# Patient Record
Sex: Female | Born: 1945 | Hispanic: No | Smoking: Never smoker
Health system: Southern US, Community
[De-identification: ages and names within clinical notes are randomized; demographics above are authoritative.]

## PROBLEM LIST (undated history)

## (undated) DIAGNOSIS — Z780 Asymptomatic menopausal state: Secondary | ICD-10-CM

## (undated) DIAGNOSIS — I10 Essential (primary) hypertension: Secondary | ICD-10-CM

## (undated) DIAGNOSIS — K5790 Diverticulosis of intestine, part unspecified, without perforation or abscess without bleeding: Secondary | ICD-10-CM

## (undated) DIAGNOSIS — R0602 Shortness of breath: Secondary | ICD-10-CM

## (undated) DIAGNOSIS — E119 Type 2 diabetes mellitus without complications: Secondary | ICD-10-CM

## (undated) DIAGNOSIS — B379 Candidiasis, unspecified: Secondary | ICD-10-CM

## (undated) DIAGNOSIS — R7303 Prediabetes: Secondary | ICD-10-CM

## (undated) DIAGNOSIS — K759 Inflammatory liver disease, unspecified: Secondary | ICD-10-CM

## (undated) DIAGNOSIS — E669 Obesity, unspecified: Secondary | ICD-10-CM

## (undated) DIAGNOSIS — M1990 Unspecified osteoarthritis, unspecified site: Secondary | ICD-10-CM

## (undated) DIAGNOSIS — Z8619 Personal history of other infectious and parasitic diseases: Secondary | ICD-10-CM

## (undated) HISTORY — DX: Asymptomatic menopausal state: Z78.0

## (undated) HISTORY — DX: Essential (primary) hypertension: I10

## (undated) HISTORY — PX: REPLACEMENT TOTAL KNEE BILATERAL: SUR1225

## (undated) HISTORY — PX: JOINT REPLACEMENT: SHX530

## (undated) HISTORY — DX: Unspecified osteoarthritis, unspecified site: M19.90

## (undated) HISTORY — DX: Prediabetes: R73.03

## (undated) HISTORY — DX: Obesity, unspecified: E66.9

## (undated) HISTORY — PX: ABDOMINAL HYSTERECTOMY: SHX81

## (undated) HISTORY — DX: Diverticulosis of intestine, part unspecified, without perforation or abscess without bleeding: K57.90

## (undated) HISTORY — DX: Candidiasis, unspecified: B37.9

---

## 2003-02-21 ENCOUNTER — Ambulatory Visit (HOSPITAL_COMMUNITY): Admission: RE | Admit: 2003-02-21 | Discharge: 2003-02-21 | Payer: Self-pay | Admitting: Family Medicine

## 2003-05-07 ENCOUNTER — Ambulatory Visit (HOSPITAL_BASED_OUTPATIENT_CLINIC_OR_DEPARTMENT_OTHER): Admission: RE | Admit: 2003-05-07 | Discharge: 2003-05-07 | Payer: Self-pay | Admitting: Obstetrics and Gynecology

## 2003-05-07 ENCOUNTER — Ambulatory Visit (HOSPITAL_COMMUNITY): Admission: RE | Admit: 2003-05-07 | Discharge: 2003-05-07 | Payer: Self-pay | Admitting: Obstetrics and Gynecology

## 2003-05-07 ENCOUNTER — Encounter (INDEPENDENT_AMBULATORY_CARE_PROVIDER_SITE_OTHER): Payer: Self-pay | Admitting: Specialist

## 2003-07-09 ENCOUNTER — Encounter (INDEPENDENT_AMBULATORY_CARE_PROVIDER_SITE_OTHER): Payer: Self-pay | Admitting: Specialist

## 2003-07-09 ENCOUNTER — Encounter (INDEPENDENT_AMBULATORY_CARE_PROVIDER_SITE_OTHER): Payer: Self-pay | Admitting: *Deleted

## 2003-07-09 ENCOUNTER — Observation Stay (HOSPITAL_COMMUNITY): Admission: RE | Admit: 2003-07-09 | Discharge: 2003-07-10 | Payer: Self-pay | Admitting: Obstetrics and Gynecology

## 2004-02-24 ENCOUNTER — Ambulatory Visit (HOSPITAL_COMMUNITY): Admission: RE | Admit: 2004-02-24 | Discharge: 2004-02-24 | Payer: Self-pay | Admitting: Family Medicine

## 2005-02-24 ENCOUNTER — Ambulatory Visit (HOSPITAL_COMMUNITY): Admission: RE | Admit: 2005-02-24 | Discharge: 2005-02-24 | Payer: Self-pay | Admitting: Family Medicine

## 2005-12-31 ENCOUNTER — Inpatient Hospital Stay (HOSPITAL_COMMUNITY): Admission: RE | Admit: 2005-12-31 | Discharge: 2006-01-04 | Payer: Self-pay | Admitting: Orthopedic Surgery

## 2006-02-25 ENCOUNTER — Ambulatory Visit (HOSPITAL_COMMUNITY): Admission: RE | Admit: 2006-02-25 | Discharge: 2006-02-25 | Payer: Self-pay | Admitting: Family Medicine

## 2007-02-28 ENCOUNTER — Ambulatory Visit (HOSPITAL_COMMUNITY): Admission: RE | Admit: 2007-02-28 | Discharge: 2007-02-28 | Payer: Self-pay | Admitting: Family Medicine

## 2008-01-19 ENCOUNTER — Inpatient Hospital Stay (HOSPITAL_COMMUNITY): Admission: RE | Admit: 2008-01-19 | Discharge: 2008-01-23 | Payer: Self-pay | Admitting: Orthopedic Surgery

## 2008-06-03 ENCOUNTER — Emergency Department (HOSPITAL_COMMUNITY): Admission: EM | Admit: 2008-06-03 | Discharge: 2008-06-03 | Payer: Self-pay | Admitting: Emergency Medicine

## 2008-06-12 ENCOUNTER — Encounter: Admission: RE | Admit: 2008-06-12 | Discharge: 2008-06-12 | Payer: Self-pay | Admitting: Orthopedic Surgery

## 2008-07-10 ENCOUNTER — Ambulatory Visit (HOSPITAL_COMMUNITY): Admission: RE | Admit: 2008-07-10 | Discharge: 2008-07-10 | Payer: Self-pay | Admitting: Family Medicine

## 2009-04-12 DIAGNOSIS — Z8619 Personal history of other infectious and parasitic diseases: Secondary | ICD-10-CM

## 2009-04-12 HISTORY — DX: Personal history of other infectious and parasitic diseases: Z86.19

## 2009-07-18 ENCOUNTER — Ambulatory Visit (HOSPITAL_COMMUNITY): Admission: RE | Admit: 2009-07-18 | Discharge: 2009-07-18 | Payer: Self-pay | Admitting: Family Medicine

## 2010-05-06 ENCOUNTER — Other Ambulatory Visit (HOSPITAL_COMMUNITY): Payer: Self-pay | Admitting: Family Medicine

## 2010-05-06 DIAGNOSIS — Z0000 Encounter for general adult medical examination without abnormal findings: Secondary | ICD-10-CM

## 2010-05-06 DIAGNOSIS — Z1231 Encounter for screening mammogram for malignant neoplasm of breast: Secondary | ICD-10-CM

## 2010-07-24 ENCOUNTER — Ambulatory Visit (HOSPITAL_COMMUNITY)
Admission: RE | Admit: 2010-07-24 | Discharge: 2010-07-24 | Disposition: A | Discharge status: Home / Self Care | Payer: Medicare Other | Source: Ambulatory Visit | Attending: Family Medicine | Admitting: Family Medicine

## 2010-07-24 DIAGNOSIS — Z1231 Encounter for screening mammogram for malignant neoplasm of breast: Secondary | ICD-10-CM

## 2010-08-25 NOTE — Discharge Summary (Signed)
[name], [name]               ACCOUNT NO.:  [id number]   MEDICAL RECORD NO.:  [id number]          PATIENT TYPE:  INP   LOCATION:  1601                         FACILITY:  [facility]   PHYSICIAN:  Almedia Balls. Ranell Patrick, M.D. DATE OF BIRTH:  Feb 03, 1946   DATE OF ADMISSION:  01/19/2008  DATE OF DISCHARGE:  01/23/2008                               DISCHARGE SUMMARY   ADMISSION DIAGNOSIS:  Right knee end-stage osteoarthritis.   DISCHARGE DIAGNOSES:  Right knee end-stage osteoarthritis, status post  total knee arthroplasty   BRIEF HISTORY:  The patient is a 65 year old female with worsening right  knee pain secondary to end-stage osteoarthritis.  The patient elected to  have a total knee arthroplasty.   PROCEDURE:  The patient had a right total knee arthroplasty using DePuy  rotating platform prosthesis by Dr. Malon Holland on January 19, 2008.   ASSISTANT:  Standley Dakins PA-C.   General anesthesia was used.  Minimal blood loss and no complications.   HOSPITAL COURSE:  The patient was admitted for the above-stated  procedure on January 19, 2008, which she tolerated well and after  adequate time in the post anesthesia care unit, she was transferred up  to to 6 [place].  On postop day #1, she was complaining about moderate pain  to the right knee and not functioning with physical therapy quite well.  On postop day #1, labs were within acceptable limits.  Postop days 2-3,  she was able to work with physical therapy much better.  Her incision  was healing well, no signs of drainage.  The incision dressing was  changed daily.  Neurovascularly, she was intact.  Postop day #4, she was  doing much better.  She was ready to be discharged home with the aid of  a home health aide.  No other complications.   DISCHARGE/PLAN:  The patient will be discharged with home health  physical therapy, nursing home health aide on January 23, 2008.  Her  condition is stable.  Her diet is low-sodium.   DRUG  ALLERGIES:  NONE.   DISCHARGE MEDICATIONS:  1. Hydrochlorothiazide 12.5 mg p.o. daily.  2. Lisinopril 20 mg p.o. daily.  3. Multivitamin daily.  4. Verapamil 240 mg p.o. daily.  5. Coumadin per pharmacy protocol.  6. Percocet 5/325 one-to-two tablets q.4-6 h., p.r.n. pain.  7. Robaxin 500 mg p.o. q.6 h.   FOLLOW UP:  The patient will follow up with Dr. Malon Holland in 2  weeks.      Thomas B. Durwin Nora, P.A.      Almedia Balls. Ranell Patrick, M.D.  Electronically Signed    TBD/MEDQ  D:  01/23/2008  T:  01/23/2008  Job:  [id number]

## 2010-08-25 NOTE — Op Note (Signed)
[name], [name]               ACCOUNT NO.:  [id number]   MEDICAL RECORD NO.:  [id number]          PATIENT TYPE:  INP   LOCATION:  1601                         FACILITY:  [facility]   PHYSICIAN:  Almedia Balls. Ranell Patrick, M.D. DATE OF BIRTH:  04/26/1945   DATE OF PROCEDURE:  01/19/2008  DATE OF DISCHARGE:                               OPERATIVE REPORT   PREOPERATIVE DIAGNOSIS:  Right knee end-stage osteoarthritis.   POSTOPERATIVE DIAGNOSIS:  Right knee end-stage osteoarthritis.   PROCEDURE PERFORMED:  Right knee total knee replacement using DePuy  segmental rotating platform prosthesis.   ATTENDING SURGEON:  Beverely Low, M.D.   ASSISTANT:  Konrad Felix [place], [place].   ANESTHESIA:  General anesthesia used plus femoral block.   ESTIMATED BLOOD LOSS:  Minimal.   TOURNIQUET TIME:  One hour and 30 minutes at 350 mmHg.   FLUID REPLACED:  18 mL crystalloid.   URINE OUTPUT:  500 mL.   INSTRUMENT COUNT:  Correct.   COMPLICATIONS:  None.   ANTIBIOTICS:  Perioperative antibiotics were given.   INDICATIONS:  The patient is a 65 year old female who presents with a  history of worsening right knee pain and function secondary to end-stage  arthritis in the right knee.  The patient this had prior left total knee  replacement and has done well with that.  She presents now for a right  total knee replacement.  Risks and benefits of surgery were discussed.  The patient elected to proceed with surgery.  Informed consent was  obtained.   DESCRIPTION OF PROCEDURE:  After an adequate level of anesthesia was  achieved, the patient was positioned supine on the table.  A nonsterile  tourniquet was placed on the right proximal thigh.  The right leg was  sterilely prepped and draped in the usual manner.  After exsanguination  of limb using the Esmarch bandage, we elevated the tourniquet to 350  mmHg. A longitudinal midline incision was created with the knee in  flexion.  A medial parapatellar arthrotomy  was created with the patella  inverted and the lateral patellofemoral ligaments divided.  The distal  femur was entered using a step-cut drill, intramedullary guide inserted.  The distal femur was resected 10 mm, set on 5 degrees right.  We then  sized the femur to 2.5 and performed anterior-posterior chamfer cuts  using more than one block.  Attention was directed towards appropriate  rotation of the implant with epicondylar axis reference.  Following  this, we removed ACL, PCL meniscal tissue and subluxed the tibia  anteriorly.  We then cut the tibia 90 degrees perpendicular to the long  axis of the tibia with minimal posterior slope.  Once this was done, we  checked our flexion/extension gaps and both were symmetric at 10 mm.  At  this point, we went ahead and completed our preparation of the tibia  with the modular keel punch and drill.  We then went ahead and sized the  tibia to a size 2.5 and prepared the tibia appropriately.  We then used  our box cut guide to remove excess bone from the femur  enabling [place] to  place the 2.5 right femur posterior cruciate substituting prosthesis.  We then trialed with a 12.5 insert and we were happy with soft tissue  balancing in full extension.  We then resurfaced the patella with  freehand technique going from 24 mm down to 16 mm to allow for 8 mm of  the polyethylene patellar button which was size 32.  We trialed the knee  through full range of motion and we were happy with the patellar  tracking with no hand technique.  We then removed the trial components,  we removed excess posterior bone off the femur using a lamina spreader  for visualization.  We inspected the knee for any other bony debris.  We  then plugged into the femur with available bone and then cemented the  components into place using DePuy high-viscosity cement. Once the cement  was allowed to harden, we trialed with a 12.5 and then a 15; we were  happy with the 15.  We were able to  get full extension and had good  balance in both extension and flexion.  We then went ahead and  thoroughly irrigated the knee.  We placed the real 15 insert in the knee  and then removed any excess cement using a quarter-inch curved  osteotome.  Next, we went ahead and closed the knee with the knee in  flexion using #1 Vicryl suture interrupted figure-of-eight followed by 0  and 2-0 Vicryl layered subcutaneous closure and staples for skin.  A  sterile dressing was applied followed by a knee immobilizer.  The  patient was taken to the recovery room.      Almedia Balls. Ranell Patrick, M.D.  Electronically Signed     SRN/MEDQ  D:  01/19/2008  T:  01/19/2008  Job:  [id number]

## 2010-08-25 NOTE — H&P (Signed)
NAMESHELANDA, [name]               ACCOUNT NO.:  [id number]   MEDICAL RECORD NO.:  [id number]          PATIENT TYPE:  INP   LOCATION:  NA                           FACILITY[facility]   PHYSICIAN:  Almedia Balls. Ranell Patrick, M.D. DATE OF BIRTH:  May 31, 1945   DATE OF ADMISSION:  DATE OF DISCHARGE:                              HISTORY & PHYSICAL   CHIEF COMPLAINT:  Right knee pain.   HISTORY OF PRESENT ILLNESS:  The patient is a 65 year old female with  worsening right knee pain secondary to severe osteoarthritis.  The  patient has had a previous left total knee arthroplasty and is  requesting arthroplasty on the right knee.  The patient's symptoms have  been refractory to conservative treatment.   PAST MEDICAL HISTORY:  Hypertension and obesity.   FAMILY MEDICAL HISTORY:  Coronary artery disease, stroke, and  hypertension.   SOCIAL HISTORY:  Patient of Dr. Carolin Coy, does not smoke but  occasional alcohol use.   DRUG ALLERGIES:  None.   CURRENT MEDICATIONS:  1. Verapamil 240 mg p.o. daily.  2. Quinaretic 25 mg p.o. daily.   REVIEW OF SYSTEMS:  Pain with ambulation.   PHYSICAL EXAMINATION:  VITAL SIGNS:  Pulse 80, respirations 16, blood  pressure 130/82.  GENERAL:  The patient is a healthy-appearing 65 year old female in no  acute distress.  Pleasant mood and affect, alert and oriented x3.  HEENT AND NECK:  Exam shows full range of motion without any tenderness.  CHEST:  Active breath sounds bilaterally with no wheezes, rhonchi or  rales.  HEART:  Shows regular rate and rhythm.  No murmur.  ABDOMEN:  Nontender, nondistended with active bowel sounds.  EXTREMITIES:  Show moderate tenderness with crepitus at the right knee  with range of motion, especially in medial joint line.  She has never no  rashes.  She has some minimal pedal edema distally, but she does have a  normal heel-toe gait.   X-rays show end-stage osteoarthritis to the right knee.   IMPRESSION:  1. Right knee  end-stage osteoarthritis.   PLAN OF ACTION:  Right total knee arthroplasty by [name].      Thomas B. Durwin Nora, P.A.      Almedia Balls. Ranell Patrick, M.D.  Electronically Signed    TBD/MEDQ  D:  01/10/2008  T:  01/10/2008  Job:  [id number]

## 2010-08-28 NOTE — Op Note (Signed)
[name], [name]                           ACCOUNT NO.:  [id number]   MEDICAL RECORD NO.:  [id number]                   PATIENT TYPE:  OBV   LOCATION:  9316                                 FACILITY:  [facility]   PHYSICIAN:  Laqueta Linden, M.D.                 DATE OF BIRTH:  Apr 07, 1946   DATE OF PROCEDURE:  07/09/2003  DATE OF DISCHARGE:                                 OPERATIVE REPORT   PREOPERATIVE DIAGNOSIS:  Complex endometrial hyperplasia with focal atypia.   POSTOPERATIVE DIAGNOSIS:  Complex endometrial hyperplasia with focal atypia.   PROCEDURES:  Vaginal hysterectomy, cytologic washings.   SURGEON:  Laqueta Linden, M.D.   ASSISTANT:  Andres Ege, M.D.   ANESTHESIA:  General LMA switched to endotracheal tube.   ESTIMATED BLOOD LOSS:  200 mL.   URINE OUTPUT:  45 mL in-and-out catheterization at the conclusion of the  procedure.   FLUIDS REPLACED:  1300 mL crystalloid.   COUNTS:  Correct x2.   COMPLICATIONS:  None.   INDICATIONS:  [name] is a 65 year old gravida 5, para 5, widowed  black female from [place] who presents with a five-year history of  postmenopausal bleeding.  She apparently underwent a D&C in [place] in 1999  which showed hyperplasia, for which she was treated with high-dose birth  control pills for a short period of time, which she discontinued and had no  further follow-up or treatment until she presented upon referral from  Rema Fendt, F.N.P.  She underwent office biopsy followed by  hysteroscopic evaluation with curettage as an outpatient, revealing complex  endometrial hyperplasia with focal atypia bordering on well-differentiated  adenocarcinoma.  Due to her morbid obesity and huge panniculus and the  substantial risk of an abdominal procedure, it was elected to perform a  vaginal hysterectomy with removal of tubes and ovaries if technically  possible.  This was discussed with both John T. Kyla Balzarine, M.D., and De Blanch,  M.D., of the [facility] Department of GYN Oncology, who concurred  with this plan.  If the patient has a significant degree of carcinoma  identified in final pathology, then additional staging might be warranted.  The patient has seen the informed consent film and has voiced her  understanding and acceptance of all risks, benefits, alternatives, and  complications, including but not limited to anesthesia risk, infection,  bleeding possibly requiring transfusion, injury to bowel, bladder, ureters,  vessels, or nerves, possibility of fistula formation, postoperative  complications including DVT, PE, pneumonia, and even death, as well as the  potential need for laparoscopy or laparotomy to perform additional staging  and/or remove ovaries if this becomes necessary.  She has seen the informed  consent film and voiced her understanding and acceptance of all risks,  understands she is at an increased risk due to her size, and understands and  accepts the potential limitations of a vaginal approach with  the hopes that  it will be the only surgery necessary, and she agrees to proceed.  She has  received 1 g of IV Ancef antibiotic prophylaxis preoperatively.  Of note,  her surgery was scheduled for prior to today but was postponed due to an  episode of bronchitis, which was treated with antibiotics and has  subsequently cleared and she has been re-cleared from the anesthesia  standpoint.   PROCEDURE:  The patient was taken to the operating room and after proper  identification and consents were ascertained, she was placed on the  operating table in supine position.  After the induction of general LMA, she  was placed in the [place] stirrups and the perineum and vagina were prepped  and draped in a routine sterile fashion.  The bladder was emptied with a red  rubber catheter.  A weighted speculum was placed on the posterior vagina.  Due to the patient's obesity and some posterior relaxation, it was  difficult  to put the speculum in place.  The cervix was noted to descend to the spines  with traction.  Th portio was injected with a 1:100,000 solution of  epinephrine.  The cervix was then circumscribed with a scalpel and the  vaginal mucosa advanced off of the cervix using a finger and a Ray-Tec  sponge.  The posterior vaginal mucosa was then tented down and the cul-de-  sac entered sharply without difficulty.  Normal saline 50 mL was then  instilled and aspirated and sent in heparinized solution for cytologic  washings as per instructions from Dr. Kyla Balzarine.  The uterosacral ligaments were  clamped, cut, and suture ligated with 0 Vicryl suture.  Cardinal ligaments  were similarly clamped, cut, and suture ligated.  The anterior peritoneal  reflection was then identified and entered sharply without obvious injury or  entry into the bladder.  The retractor was placed anteriorly to retract the  bladder out of the operative field.  The uterine vessels were clamped  bilaterally, incorporating both anterior and posterior peritoneum, with  pedicles cut and suture ligated.  The uterine fundus was then flipped  posteriorly and the proximal adnexal pedicles were clamped, incorporating  both utero-ovarian ligament, proximal tube, and round ligament, with  excision of specimen, which was sent for final sectioning to pathology.  Both ovaries were tiny and streak-like in appearance and were out of reach  with no descent into the pelvis, and it was felt that an attempt to remove  these vaginally would radically increase the patient's need for a  laparotomy, and as per previous discussions it was elected not to proceed  with oophorectomy for this reason.  The adnexal pedicles were doubly ligated  with a free tie and then a stitch of 0 Vicryl.  Hemostasis was excellent.  There was some bleeding from the posterior vaginal cuff.  The posterior peritoneum was reefed to the posterior cuff using a running locked  stitch of  0 Vicryl with marked improvement in hemostasis.  The adnexal pedicles were  hemostatic.  The pedicles were cut and released into the peritoneal cavity.  The vaginal cuff was then closed from side to side using interrupted figure-  of-eight sutures of 0 Vicryl.  Hemostasis was noted to be excellent.  The  bladder was in-and-out catheterized at the conclusion of the procedure for  45 mL of clear urine that had accumulated during the less than one-hour  procedure.  In the course of the procedure an endotracheal tube was placed  for optimal ventilation.  The patient was successfully extubated and stable  on transfer to the recovery room.  She received Toradol 30 mg IV and 30 mg  IM prior to the conclusion of the procedure.  She will be admitted for  overnight observation with aggressive early ambulation as well as PAS  stockings while in bed to decrease her risk of DVT formation.  Final  pathology will be reviewed with subsequent management plan pending those  results.   COMPLICATIONS:  None.   ESTIMATED BLOOD LOSS:  200 mL.   URINE OUTPUT:  45 mL.   FLUIDS REPLACED:  1300 mL crystalloid.   COUNTS:  Correct x2.                                               Laqueta Linden, M.D.    LKS/MEDQ  D:  07/09/2003  T:  07/09/2003  Job:  [id number]   cc:   Marisue Humble F.N.P. [facility]

## 2010-08-28 NOTE — Op Note (Signed)
NAMESRINIKA, [name]                           ACCOUNT NO.:  [id number]   MEDICAL RECORD NO.:  [id number]                   PATIENT TYPE:  AMB   LOCATION:  NESC                                 FACILITY:  [facility]   PHYSICIAN:  Laqueta Linden, M.D.                 DATE OF BIRTH:  09-05-1945   DATE OF PROCEDURE:  05/07/2003  DATE OF DISCHARGE:                                 OPERATIVE REPORT   PREOPERATIVE DIAGNOSES:  Postmenopausal abnormal bleeding, complex  endometrial hyperplasia, rule out adenocarcinoma.   POSTOPERATIVE DIAGNOSES:  Postmenopausal abnormal bleeding, complex  endometrial hyperplasia, rule out adenocarcinoma.   PROCEDURE:  Diagnostic hysteroscopy with dilatation and curettage.   SURGEON:  Laqueta Linden, M.D.   ANESTHESIA:  General LMA.   NET SORBITOL INTAKE:  Negligible.   ESTIMATED BLOOD LOSS:  Less than 20 mL.   COMPLICATIONS:  None.   SPECIMENS:  To pathology.   INDICATIONS:  [name] is a 65 year old, gravida 5, para 5, menopausal  female with a 5 year history of intermittent postmenopausal bleeding.  She  apparently underwent a D&C in [place], revealing hyperplasia.  She was  treated with high-dose birth control pills with an increase in her bleeding,  so she quit the pills and has had no follow-up since.  She continues to have  intermittent bleeding since.  She underwent in-office endometrial sampling  which revealed sampling complex endometrial hyperplasia with a rare focus of  atypia.  She is now to undergo a diagnostic hysteroscopy with D&C to rule  out significant atypia or adenocarcinoma which would necessitate surgical  management.  She has seen the informed consent film, voiced her  understanding and acceptance of all the risks, benefits, alternatives, and  limitations to the procedure and agrees to proceed.   DESCRIPTION OF PROCEDURE:  The patient was taken to the operating room and  after proper identification and consents were  ascertained, she was placed on  the operating room table in supine position.  After the induction of general  LMA, she was placed in the [place] stirrups, and the perineum and vagina were  prepped and draped in a routine sterile fashion.  The bladder was emptied  with in-and-out catheterization.  Bimanual examination confirmed an anterior  pear-size, mobile uterus.  A weighted speculum was placed in the vagina and  a Deaver placed anteriorly, as there was so much perineal relaxation that  she could not maintain a speculum.  She had a significant amount of  cervicouterine descent with the cervix distending to within 2 cm of the  introitus.  The internal os was patent to a #17 dilator and sounded to 9-10  cm.  The internal os was dilated up to a #21 Pratt dilator, and the  hysteroscope with continuous sorbitol infusion was then inserted.  The  endocervical canal was free of lesions.  The endometrial cavity was  visualized  and appeared sort of diffusely thickened with some calcified-  appearing areas on the posterior fundus but no focal lesions or polyps  identified.  The scope was then withdrawn.  Sharp curettage productive of a  large amount of tissue was then performed.  It was felt that all surfaces of  the endometrial cavity had been aggressively sampled.  All instruments were  removed.  There was no active bleeding from the cervix.  The tenaculum site  was hemostatic.  The patient was stable on transfer to the PACU.  She will  be observed and discharged per anesthesia protocol.  She is to follow up in  the office in 2-3 weeks' time.  She was given routine verbal and written  discharge instructions and told to take Advair, Aleve, or Tylenol as needed  for cramping.  She will be contacted regarding the pathology and further  plans.                                               Laqueta Linden, M.D.    LKS/MEDQ  D:  05/07/2003  T:  05/07/2003  Job:  [id number]   cc:   Rema Fendt, FNP   [facility].

## 2010-08-28 NOTE — H&P (Signed)
NAMENUBIA, [name]               ACCOUNT NO.:  [id number]   MEDICAL RECORD NO.:  [id number]          PATIENT TYPE:  INP   LOCATION:  NA                           FACILITY:  [facility]   PHYSICIAN:  Almedia Balls. Ranell Patrick, M.D. DATE OF BIRTH:  [birth year]   DATE OF ADMISSION:  DATE OF DISCHARGE:                                HISTORY & PHYSICAL   [name] is a 65 year old female who comes in complaining about left  knee pain.   HISTORY OF PRESENT ILLNESS:  The patient has been having some increasing  left knee pain over the last several years.  She has been having an antalgic  gait and worsening pain that has been refractory to conservative methods.  The patient has elected to have a left total knee arthroplasty by [name].   PAST MEDICAL HISTORY:  Significant for:  1. Hypertension.  2. Diverticulitis.  3. Osteoarthritis.   DRUG ALLERGIES:  None.   CURRENT MEDICATIONS:  1. Verapamil 240 mg p.o. daily.  2. Quinaretic 20/25 one tablet daily.  3. Lamisil 250 mg one tablet p.o. daily.  4. Multivitamin.  5. Aspirin 81 mg p.o. daily.   FAMILY MEDICAL HISTORY:  Significant for stroke and osteoarthritis.   SOCIAL HISTORY:  The patient is a widow, she is retired.  Does not smoke or  use alcohol.  Primary care physician is Dr. Gerri Spore.   PHYSICAL EXAMINATION:  VITAL SIGNS:  Pulse 80, respirations 18, blood  pressure 142/88.  GENERAL:  Healthy-appearing 65 year old female in no acute distress,  pleasant mood and affect, alert and oriented x3.  HEAD AND NECK:  Shows full range of motion without any tenderness or  difficulty with range of motion.  She has full range of motion without and  difficulty and no tenderness to the cervical, thoracic, or lumbar spines.  Cranial nerves II-XII are grossly intact.  CHEST:  Active breath sounds bilaterally with no wheezes, rhonchi, or rales.  HEART:  Shows regular rate and rhythm with no murmur.  ABDOMEN:  Nontender, nondistended, with  active bowel sounds.  EXTREMITIES:  Moderate tenderness of the left knee with crepitus.  She does  have an antalgic gait.  She does have some mild pedal edema bilaterally but  neurovascularly she is intact bilateral upper and lower extremities.   X-rays show end-stage osteoarthritis left knee.   IMPRESSION:  End-stage osteoarthritis left knee.   PLAN OF ACTION:  To have a left total knee arthroplasty by [name].      Thomas B. Dixon, P.A.    ______________________________  Almedia Balls. Ranell Patrick, M.D.    TBD/MEDQ  D:  12/23/2005  T:  12/23/2005  Job:  [id number]

## 2010-08-28 NOTE — Op Note (Signed)
NAMESHAMANDA, [name]               ACCOUNT NO.:  [id number]   MEDICAL RECORD NO.:  [id number]          PATIENT TYPE:  INP   LOCATION:  0008                         FACILITY:  [facility]   PHYSICIAN:  Almedia Balls. Ranell Patrick, M.D. DATE OF BIRTH:  1945/08/15   DATE OF PROCEDURE:  12/31/2005  DATE OF DISCHARGE:                                 OPERATIVE REPORT   PREOPERATIVE DIAGNOSIS:  Left knee end-stage osteoarthritis.   POSTOPERATIVE DIAGNOSIS:  Left knee end-stage osteoarthritis.   PROCEDURE PERFORMED:  Left total knee replacement using DePuy Sigma rotating  platform prosthesis.   SURGEON:  Almedia Balls. Ranell Patrick, M.D.   ASSISTANT:  Donnie Coffin. Dixon, PA-C.   ANESTHESIA:  General.   ESTIMATED BLOOD LOSS:  0.   TOURNIQUET TIME:  Two hours, 10 minutes.   FLUIDS REPLACED:  Crystalloid 2200 cc.   URINE OUTPUT:  250 cc.   INSTRUMENT COUNT:  Correct.   COMPLICATIONS:  None.   PREOPERATIVE ANTIBIOTICS:  Given.   INDICATIONS:  Patient is a 65 year old female status post knee arthroscopy  on the left side with findings of end-stage osteoarthritis and bone-on-bone  arthritis.  The patient has failed conservative management at this point,  consisting of arthroscopy, injections, therapy, anti-inflammatory  medications, pain medications, and activity modification, who presents now  for operative treatment of disabling pain secondary to arthritis.  Informed  consent was obtained.   DESCRIPTION OF PROCEDURE:  After an adequate level of anesthesia was  achieved, the patient was positioned supine on the operating room table.  A  nonsterile tourniquet was placed in the left proximal thigh.  The left leg  sterilely prepped and draped in the usual manner.  With the knee elevated,  we exsanguinated the limb using esmarch bandage.  The tourniquet was  elevated to 350 mmHg.  A longitudinal midline incision was carried with the  knee in flexion through a sterile field.  Dissection was carried sharply  down  through the subcutaneous tissues.  Medial parapatellar arthrotomy  created.  ACL and PCL removed.  The patella was subluxed laterally.  I went  ahead and opened the distal femur with a subcut drill, placing the  intramedullary distal femoral cutting guide with 5 degree left setting and  10 mm of distal resection.  Using an oscillating saw, resected the distal  femur.  Went ahead and incised the femur to a size 3 anterior down and then  went ahead and placed the size 3, 4-in-1 cutting block on the distal femur  with referencing off the epicondylar axis.  Went ahead and made our anterior  posterior cuts, protecting soft tissues and the chamfer cuts as well.  Went  ahead and subluxed the tibia forward with a Crego elevator and then went  ahead and performed my proximal tibial cut with an external reference.  This  was set on a 0 degree posterior slope with a 90 degree perpendicular cut.  Once we made this cut, we went ahead and checked our gaps.  Flexion and  extension gaps were excellent and symmetrical.  Went ahead at this point and  went  back to the femur, resecting the bone for the posterior cruciate,  substituting prosthesis.  This was a box cut, which we made utilizing an  oscillating saw.  Next approached the tibia.  Went ahead and made our keel  punch and prepared for the tibia.  This was a size 2.5 tibia and a 3 femur.  Went ahead and placed our trial prosthesis in place and reduced the knee.  It was a little loose with a 10 but perfect with a 12.5 insert and flexion  and extension balanced with good stability. Went ahead and resurfaced our  patella.  Started out with a 23 mm depth of patella.  Went ahead and took it  down to 15 to replace 8.5 with a 35 button.  We went ahead and drilled our  holes for a 35 button, placed that on, and we had excellent tracking with a  no touch technique.  Removed all trial components.  Went ahead and  thoroughly irrigated.  We removed all posterior  bone off the posterior  condyles, inspected for any loose bone or any excess soft tissue that needed  to be removed, once this was done, went ahead and cemented the components  into place, utilizing Smart Fit cement by DePuy.  Once the cement was  allowed to harden with a 12.5 insert in place with the knee in extension,  then we went ahead and retrialed, and were happy with our flexion and  extension gaps.  We had achieved full extension and had excellent patellar  tracking.  We then removed the trial 12.5 insert, placed the 12.5 posterior  stabilized size 3 poly in place, reduced the knee, took it through a full  range of motion.  Were happy with the result.  Thoroughly irrigated the  knee, then closed over drain with layered closure.  PDS suture #1 for the  medial parapatellar arthrotomy with the knee in flexion, followed by layered  closure with 0 and 2-0 Vicryl for the subcu in layers and then followed by  the skin with a running 4-0 Monocryl.  Steri-Strips are applied, followed by  a sterile dressing.  Patient is placed in a knee immobilizer.  We also  injected posterior capsule with some 0.25% Marcaine with epinephrine to  assist with postoperative bleeding.  We also plugged the end of the canal  with a piece of bone plug prior to placing the femoral component on.  Patient tolerated the surgery well and was taken to the recovery room in  stable condition.           ______________________________  Almedia Balls Ranell Patrick, M.D.     SRN/MEDQ  D:  12/31/2005  T:  01/03/2006  Job:  [id number]

## 2010-11-16 ENCOUNTER — Other Ambulatory Visit: Payer: Self-pay | Admitting: Family Medicine

## 2010-11-19 ENCOUNTER — Ambulatory Visit
Admission: RE | Admit: 2010-11-19 | Discharge: 2010-11-19 | Disposition: A | Discharge status: Home / Self Care | Payer: Medicare Other | Source: Ambulatory Visit | Attending: Family Medicine | Admitting: Family Medicine

## 2010-12-07 ENCOUNTER — Encounter (INDEPENDENT_AMBULATORY_CARE_PROVIDER_SITE_OTHER): Payer: Self-pay | Admitting: General Surgery

## 2010-12-08 ENCOUNTER — Encounter (INDEPENDENT_AMBULATORY_CARE_PROVIDER_SITE_OTHER): Payer: Self-pay | Admitting: General Surgery

## 2010-12-08 ENCOUNTER — Ambulatory Visit (INDEPENDENT_AMBULATORY_CARE_PROVIDER_SITE_OTHER): Payer: Medicare Other | Admitting: General Surgery

## 2010-12-08 DIAGNOSIS — K8020 Calculus of gallbladder without cholecystitis without obstruction: Secondary | ICD-10-CM | POA: Insufficient documentation

## 2010-12-08 NOTE — Patient Instructions (Signed)
Plan for laparoscopic cholecystectomy with intraoperative cholangiogram 

## 2010-12-08 NOTE — Progress Notes (Signed)
Subjective:     Patient ID: [name], female   DOB: 03/19/1946, 65 y.o.   MRN: [id number]  HPI We are asked to see the patient in consultation by Dr. Baird Lyons to evaluate her for gallstones. The patient is a 65 year old female who experienced an episode of pain in her upper abdomen in the last couple of weeks. The pain lasted a good 24 hours before resolved. She denies any nausea or vomiting. No fevers or chills. No chest pain or shortness of breath. She underwent an ultrasound which did show stones in her gallbladder but no gallbladder wall thickening or ductal dilatation.  Review of Systems  Constitutional: Negative.   HENT: Negative.   Eyes: Negative.   Respiratory: Negative.   Cardiovascular: Negative.   Gastrointestinal: Negative.   Genitourinary: Negative.   Musculoskeletal: Negative.   Skin: Negative.   Neurological: Negative.   Hematological: Negative.   Psychiatric/Behavioral: Negative.    Past Medical History  Diagnosis Date  . Hypertension   . Diverticulosis   . Menopause   . 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 Obesity   . Hearing loss   . Prediabetes   . Gout   . Cholelithiasis   . Candida infection     fingernails   Past Surgical History  Procedure Date  . Abdominal hysterectomy   . Replacement total knee bilateral     2007/2009   Current outpatient prescriptions:Ascorbic Acid (VITAMIN C) 100 MG tablet, Take 100 mg by mouth daily.  , Disp: , Rfl: ;  aspirin 81 MG tablet, Take 81 mg by mouth daily.  , Disp: , Rfl: ;  Calcium Carbonate-Vitamin D (CALCIUM + D PO), Take by mouth daily.  , Disp: , Rfl: ;  clotrimazole-betamethasone (LOTRISONE) cream, Apply topically as needed.  , Disp: , Rfl:  fish oil-omega-3 fatty acids 1000 MG capsule, Take 1 g by mouth daily.  , Disp: , Rfl: ;  lisinopril (PRINIVIL,ZESTRIL) 20 MG tablet, Take 20 mg by mouth daily.  , Disp: , Rfl: ;  Multiple Vitamin (MULTIVITAMIN PO), Take by mouth daily.  , Disp: , Rfl: ;   verapamil (VERELAN PM) 240 MG 24 hr capsule, Take 240 mg by mouth at bedtime.  , Disp: , Rfl:   No Known Allergies     Objective:   Physical Exam  Constitutional: She is oriented to person, place, and time. She appears well-developed and well-nourished.       Morbidly obese  HENT:  Head: Normocephalic and atraumatic.  Eyes: Conjunctivae and EOM are normal. Pupils are equal, round, and reactive to light.  Neck: Normal range of motion. Neck supple.  Cardiovascular: Normal rate, regular rhythm and normal heart sounds.   Pulmonary/Chest: Effort normal and breath sounds normal.  Abdominal: Soft. Bowel sounds are normal.  Musculoskeletal: Normal range of motion.  Neurological: She is alert and oriented to person, place, and time.  Skin: Skin is warm and dry.  Psychiatric: She has a normal mood and affect. Her behavior is normal.       Assessment:     Symptomatic gallstones with morbid obesity    Plan:     Because of the risk of further painful episodes and possible pancreatitis I think she would benefit from having her gallbladder removed. She would also like to have this done. I have discussed with her in detail the risks and benefits of the operation to remove the gallbladder as well as some of the technical aspects  and she understands and wishes to proceed.

## 2010-12-22 ENCOUNTER — Encounter (HOSPITAL_COMMUNITY)
Admission: RE | Admit: 2010-12-22 | Discharge: 2010-12-22 | Disposition: A | Discharge status: Home / Self Care | Payer: Medicare Other | Source: Ambulatory Visit | Attending: General Surgery | Admitting: General Surgery

## 2010-12-22 ENCOUNTER — Ambulatory Visit (HOSPITAL_COMMUNITY)
Admission: RE | Admit: 2010-12-22 | Discharge: 2010-12-22 | Disposition: A | Discharge status: Home / Self Care | Payer: Medicare Other | Source: Ambulatory Visit | Attending: General Surgery | Admitting: General Surgery

## 2010-12-22 ENCOUNTER — Other Ambulatory Visit (INDEPENDENT_AMBULATORY_CARE_PROVIDER_SITE_OTHER): Payer: Self-pay | Admitting: General Surgery

## 2010-12-22 DIAGNOSIS — Z01811 Encounter for preprocedural respiratory examination: Secondary | ICD-10-CM | POA: Insufficient documentation

## 2010-12-22 DIAGNOSIS — K8020 Calculus of gallbladder without cholecystitis without obstruction: Secondary | ICD-10-CM

## 2010-12-22 DIAGNOSIS — I10 Essential (primary) hypertension: Secondary | ICD-10-CM | POA: Insufficient documentation

## 2010-12-22 DIAGNOSIS — Z01810 Encounter for preprocedural cardiovascular examination: Secondary | ICD-10-CM | POA: Insufficient documentation

## 2010-12-22 DIAGNOSIS — Z01812 Encounter for preprocedural laboratory examination: Secondary | ICD-10-CM | POA: Insufficient documentation

## 2010-12-22 LAB — COMPREHENSIVE METABOLIC PANEL
ALT: 15 U/L (ref 0–35)
AST: 18 U/L (ref 0–37)
Albumin: 3.3 g/dL — ABNORMAL LOW (ref 3.5–5.2)
Alkaline Phosphatase: 80 U/L (ref 39–117)
BUN: 17 mg/dL (ref 6–23)
CO2: 32 mEq/L (ref 19–32)
Calcium: 10.4 mg/dL (ref 8.4–10.5)
Chloride: 103 mEq/L (ref 96–112)
Creatinine, Ser: 0.74 mg/dL (ref 0.50–1.10)
GFR calc Af Amer: >60 mL/min (ref >60)
GFR calc non Af Amer: >60 mL/min (ref >60)
Glucose, Bld: 104 mg/dL — ABNORMAL HIGH (ref 70–99)
Potassium: 3.8 mEq/L (ref 3.5–5.1)
Sodium: 143 mEq/L (ref 135–145)
Total Bilirubin: 0.8 mg/dL (ref 0.3–1.2)
Total Protein: 7.3 g/dL (ref 6.0–8.3)

## 2010-12-22 LAB — DIFFERENTIAL
Basophils Absolute: 0.1 10*3/uL (ref 0.0–0.1)
Basophils Relative: 1 % (ref 0–1)
Eosinophils Absolute: 0.8 10*3/uL — ABNORMAL HIGH (ref 0.0–0.7)
Eosinophils Relative: 7 % — ABNORMAL HIGH (ref 0–5)
Lymphocytes Relative: 33 % (ref 12–46)
Lymphs Abs: 3.7 10*3/uL (ref 0.7–4.0)
Monocytes Absolute: 0.7 10*3/uL (ref 0.1–1.0)
Monocytes Relative: 6 % (ref 3–12)
Neutro Abs: 5.8 10*3/uL (ref 1.7–7.7)
Neutrophils Relative %: 53 % (ref 43–77)

## 2010-12-22 LAB — CBC
HCT: 41.6 % (ref 36.0–46.0)
Hemoglobin: 14.4 g/dL (ref 12.0–15.0)
MCH: 30.8 pg (ref 26.0–34.0)
MCHC: 34.6 g/dL (ref 30.0–36.0)
MCV: 88.9 fL (ref 78.0–100.0)
Platelets: 240 10*3/uL (ref 150–400)
RBC: 4.68 MIL/uL (ref 3.87–5.11)
RDW: 14.3 % (ref 11.5–15.5)
WBC: 11.1 10*3/uL — ABNORMAL HIGH (ref 4.0–10.5)

## 2010-12-22 LAB — SURGICAL PCR SCREEN: MRSA, PCR: NEGATIVE

## 2010-12-29 ENCOUNTER — Ambulatory Visit (HOSPITAL_COMMUNITY): Payer: Medicare Other

## 2010-12-29 ENCOUNTER — Observation Stay (HOSPITAL_COMMUNITY)
Admission: RE | Admit: 2010-12-29 | Discharge: 2010-12-30 | Disposition: A | Discharge status: Home / Self Care | Payer: Medicare Other | Source: Ambulatory Visit | Attending: General Surgery | Admitting: General Surgery

## 2010-12-29 ENCOUNTER — Other Ambulatory Visit (INDEPENDENT_AMBULATORY_CARE_PROVIDER_SITE_OTHER): Payer: Self-pay | Admitting: General Surgery

## 2010-12-29 DIAGNOSIS — E669 Obesity, unspecified: Secondary | ICD-10-CM | POA: Insufficient documentation

## 2010-12-29 DIAGNOSIS — Z01818 Encounter for other preprocedural examination: Secondary | ICD-10-CM | POA: Insufficient documentation

## 2010-12-29 DIAGNOSIS — K8020 Calculus of gallbladder without cholecystitis without obstruction: Principal | ICD-10-CM | POA: Insufficient documentation

## 2010-12-29 DIAGNOSIS — Z01812 Encounter for preprocedural laboratory examination: Secondary | ICD-10-CM | POA: Insufficient documentation

## 2010-12-29 DIAGNOSIS — K8010 Calculus of gallbladder with chronic cholecystitis without obstruction: Secondary | ICD-10-CM

## 2010-12-29 DIAGNOSIS — I10 Essential (primary) hypertension: Secondary | ICD-10-CM | POA: Insufficient documentation

## 2010-12-29 DIAGNOSIS — Z01810 Encounter for preprocedural cardiovascular examination: Secondary | ICD-10-CM | POA: Insufficient documentation

## 2011-01-01 NOTE — Op Note (Signed)
[name], [name]               ACCOUNT NO.:  [id number]  MEDICAL RECORD NO.:  [id number]  LOCATION:  [facility]SC                         FACILITY:  [facility]  PHYSICIAN:  Ollen Gross. Vernell Morgans, M.D. DATE OF BIRTH:  [birth year]  DATE OF PROCEDURE:  12/29/2010 DATE OF DISCHARGE:                              OPERATIVE REPORT   PREOPERATIVE DIAGNOSIS:  Gallstones.  POSTOPERATIVE DIAGNOSIS:  Gallstones.  PROCEDURE:  Laparoscopic cholecystectomy with intraoperative cholangiogram.  SURGEON:  Ollen Gross. Vernell Morgans, MD  ASSISTANT:  Brayton El, PA-C  ANESTHESIA:  General endotracheal.  PROCEDURE:  After informed consent was obtained, the patient was brought to the operating room, placed in a supine position on the operating room table.  After adequate induction of general anesthesia, the patient's abdomen was prepped with ChloraPrep, allowed to dry, and draped in usual sterile manner.  The area above the umbilicus was infiltrated with 0.25% Marcaine.  A small incision was made with a 15-blade knife.  This incision was carried down through the subcutaneous tissue bluntly with hemostat and Army-Navy retractors until the linea alba was identified. We attempted to grasp the linea alba and then divided the linea alba with a 15-blade knife.  Unfortunately at that point, we did get into the very, very base of the umbilicus while doing this.  We repaired the umbilicus with 3-0 Vicryl stitches.  We then opened the linea alba more superior to this without difficulty.  The preperitoneal space was probed bluntly with hemostat until the peritoneum was opened and access was gained to the abdominal cavity.  A 0-Vicryl pursestring stitch was placed in the fascia around the opening.  Hasson cannula was placed through the opening and anchored in place with previously placed Vicryl pursestring stitch.  The abdomen was then insufflated carbon dioxide without difficulty.  The patient was placed in reverse  Trendelenburg position, rotated with the right side up.  The laparoscope was inserted through the Hasson cannula and right lower quadrant was inspected and the epigastric region was then infiltrated with 0.25% Marcaine.  A small incision was made with a 15-blade knife.  A 10-mm port was then placed bluntly through this incision into the abdominal cavity under direct vision.  Sites were then chosen laterally on the right side of abdomen with placement of 5-mm ports.  Each of these areas were infiltrated with 0.25% Marcaine.  Small stab incisions were made with 15-blade knife and 5-mm ports were placed bluntly through these incisions into the abdominal cavity under direct vision.  Blunt grasper was placed through the lateral-most 5-mm port and used to grasp the dome of the gallbladder and elevated anteriorly and superiorly.  Another blunt grasper was placed through the other 5-mm port and used to retract on the body and neck of the gallbladder.  A dissector was placed through the epigastric port using the electrocautery.  Under the peritoneal reflection, the gallbladder neck was opened.  Blunt dissection was then carried out in this area until the gallbladder neck cystic duct junction was readily identified and a good window was created.  Single clip was placed on the gallbladder neck.  A small ductotomy was made just below the clip with a  laparoscopic scissors.  A 14-gauge Angiocath was placed percutaneously through the anterior abdominal wall under direct vision.  A Reddick cholangiogram catheter was placed through the Angiocath and flushed. The Reddick catheter was then placed in the cystic duct and anchored in place with the clip.  A cholangiogram was obtained that showed no filling defects, good emptying in duodenum, and adequate length on the cystic duct.  The anchoring clip and catheter were removed from the patient.  Three clips were placed proximally on the cystic duct and duct was  divided between the two sets of clips.  Posterior to this, cystic arteries were identified and again dissected bluntly in a circumferential manner until a good window was created.  Two clips were placed proximally on the artery and one distally in the artery, was divided between the two.  Next, a laparoscopic hook cautery device was used to separate the gallbladder from the liver bed.  Prior to completely detaching the gallbladder from the liver bed, the liver bed was inspected and several small bleeding points were coagulated with electrocautery until the area was completely hemostatic.  Small piece of Surgicel was placed in the gallbladder bed.  A laparoscopic bag was then inserted through the epigastric port, gallbladder was placed in the bag, and bag was sealed.  The abdomen was then irrigated with copious amounts of saline until the effluent was clear.  The laparoscope was then moved to the epigastric port.  A gallbladder grasper was placed through the Hasson cannula and used to grasp the opening of the bag.  The bag of the gallbladder was removed with the Hasson cannula through the supraumbilical port without difficulty.  The fascial defect was closed with previously placed Vicryl pursestring stitch as well as with another interrupted 0-Vicryl stitch.  The rest of the ports removed under direct vision were found to be hemostatic.  Gas was allowed to escape.  The skin incisions were all closed with interrupted 4-0 Monocryl subcuticular stitches.  Dermabond dressings were applied.  The patient tolerated the procedure well.  At the end of the case, all needle, sponge, and instrument counts were correct.  The patient was then awakened and taken to the recovery room in a stable condition.     Ollen Gross. Vernell Morgans, M.D.     PST/MEDQ  D:  12/29/2010  T:  12/29/2010  Job:  [id number]  Electronically Signed by Chevis Pretty III M.D. on 01/01/2011 02:04:45 PM

## 2011-01-11 LAB — COMPREHENSIVE METABOLIC PANEL
ALT: 22
AST: 20
Albumin: 3.3 — ABNORMAL LOW
Alkaline Phosphatase: 60
Chloride: 104
GFR calc Af Amer: >60
Potassium: 3.6
Sodium: 141
Total Bilirubin: 1.2
Total Protein: 7.2

## 2011-01-11 LAB — BASIC METABOLIC PANEL
BUN: 10
BUN: 12
BUN: 8
CO2: 27
CO2: 30
Calcium: 8.1 — ABNORMAL LOW
Calcium: 8.2 — ABNORMAL LOW
Calcium: 8.3 — ABNORMAL LOW
Creatinine, Ser: 0.7
GFR calc non Af Amer: >60
GFR calc non Af Amer: >60
Glucose, Bld: 100 — ABNORMAL HIGH
Glucose, Bld: 117 — ABNORMAL HIGH
Glucose, Bld: 141 — ABNORMAL HIGH
Potassium: 3.6
Sodium: 140
Sodium: 140

## 2011-01-11 LAB — CBC
HCT: 34.6 — ABNORMAL LOW
HCT: 37.5
HCT: 44.6
Hemoglobin: 11.5 — ABNORMAL LOW
MCHC: 32.9
MCHC: 33.4
MCHC: 33.6
MCV: 92.1
MCV: 92.9
Platelets: 126 — ABNORMAL LOW
Platelets: 137 — ABNORMAL LOW
Platelets: 166
RBC: 4.84
RDW: 13.9
RDW: 14.5
WBC: 10
WBC: 8.9

## 2011-01-11 LAB — URINALYSIS, ROUTINE W REFLEX MICROSCOPIC
Glucose, UA: NEGATIVE
Hgb urine dipstick: NEGATIVE
Protein, ur: NEGATIVE
Specific Gravity, Urine: 1.027
Urobilinogen, UA: 1

## 2011-01-11 LAB — DIFFERENTIAL
Basophils Relative: 1
Eosinophils Absolute: 0.7
Eosinophils Relative: 7 — ABNORMAL HIGH
Lymphs Abs: 3.3
Monocytes Absolute: 0.6
Monocytes Relative: 6
Neutrophils Relative %: 54

## 2011-01-11 LAB — PROTIME-INR
INR: 0.9
INR: 1.6 — ABNORMAL HIGH
INR: 1.7 — ABNORMAL HIGH
Prothrombin Time: 19 — ABNORMAL HIGH
Prothrombin Time: 19.7 — ABNORMAL HIGH
Prothrombin Time: 20.9 — ABNORMAL HIGH

## 2011-01-11 LAB — TYPE AND SCREEN: Antibody Screen: NEGATIVE

## 2011-02-02 ENCOUNTER — Encounter (INDEPENDENT_AMBULATORY_CARE_PROVIDER_SITE_OTHER): Payer: Self-pay | Admitting: General Surgery

## 2011-02-02 ENCOUNTER — Ambulatory Visit (INDEPENDENT_AMBULATORY_CARE_PROVIDER_SITE_OTHER): Payer: Medicare Other | Admitting: General Surgery

## 2011-02-02 DIAGNOSIS — K8020 Calculus of gallbladder without cholecystitis without obstruction: Secondary | ICD-10-CM

## 2011-02-02 NOTE — Progress Notes (Signed)
Subjective:     Patient ID: [name], female   DOB: Jun 08, 1945, 65 y.o.   MRN: [id number]  HPI The patient is a 65 year old black female who is well over a month out from a laparoscopic cholecystectomy. She's doing very well. She has no complaints today. Her appetite is good in bowels are working normally.  Review of Systems     Objective:   Physical Exam On exam her abdomen is soft and nontender. Her incisions are all healing nicely.    Assessment:     Over a month out from a laparoscopic cholecystectomy.    Plan:     At this point I think she can return to her normal activities without any restrictions. Will plan to see her back on a p.r.n. basis.

## 2011-02-02 NOTE — Patient Instructions (Signed)
May return to all normal activities 

## 2011-07-07 ENCOUNTER — Other Ambulatory Visit (HOSPITAL_COMMUNITY): Payer: Self-pay | Admitting: Family Medicine

## 2011-07-07 DIAGNOSIS — Z1231 Encounter for screening mammogram for malignant neoplasm of breast: Secondary | ICD-10-CM

## 2011-08-05 ENCOUNTER — Ambulatory Visit (HOSPITAL_COMMUNITY)
Admission: RE | Admit: 2011-08-05 | Discharge: 2011-08-05 | Disposition: A | Discharge status: Home / Self Care | Payer: Medicare Other | Source: Ambulatory Visit | Attending: Family Medicine | Admitting: Family Medicine

## 2011-08-05 DIAGNOSIS — Z1231 Encounter for screening mammogram for malignant neoplasm of breast: Secondary | ICD-10-CM | POA: Insufficient documentation

## 2012-07-07 ENCOUNTER — Other Ambulatory Visit (HOSPITAL_COMMUNITY): Payer: Self-pay | Admitting: Family Medicine

## 2012-07-07 DIAGNOSIS — Z1231 Encounter for screening mammogram for malignant neoplasm of breast: Secondary | ICD-10-CM

## 2012-08-07 ENCOUNTER — Ambulatory Visit (HOSPITAL_COMMUNITY)
Admission: RE | Admit: 2012-08-07 | Discharge: 2012-08-07 | Disposition: A | Discharge status: Home / Self Care | Payer: Medicare Other | Source: Ambulatory Visit | Attending: Family Medicine | Admitting: Family Medicine

## 2012-08-07 DIAGNOSIS — Z1231 Encounter for screening mammogram for malignant neoplasm of breast: Secondary | ICD-10-CM

## 2013-06-10 DIAGNOSIS — E119 Type 2 diabetes mellitus without complications: Secondary | ICD-10-CM

## 2013-06-10 HISTORY — DX: Type 2 diabetes mellitus without complications: E11.9

## 2013-07-04 ENCOUNTER — Other Ambulatory Visit: Payer: Self-pay | Admitting: Neurosurgery

## 2013-07-05 ENCOUNTER — Encounter (HOSPITAL_COMMUNITY): Payer: Self-pay | Admitting: Pharmacy Technician

## 2013-07-06 ENCOUNTER — Other Ambulatory Visit (HOSPITAL_COMMUNITY): Payer: Self-pay | Admitting: Family Medicine

## 2013-07-06 DIAGNOSIS — Z1231 Encounter for screening mammogram for malignant neoplasm of breast: Secondary | ICD-10-CM

## 2013-07-16 ENCOUNTER — Encounter (HOSPITAL_COMMUNITY)
Admission: RE | Admit: 2013-07-16 | Discharge: 2013-07-16 | Disposition: A | Discharge status: Home / Self Care | Payer: Medicare Other | Source: Ambulatory Visit | Attending: Neurosurgery | Admitting: Neurosurgery

## 2013-07-16 ENCOUNTER — Encounter (HOSPITAL_COMMUNITY): Payer: Self-pay

## 2013-07-16 DIAGNOSIS — Z01810 Encounter for preprocedural cardiovascular examination: Secondary | ICD-10-CM | POA: Insufficient documentation

## 2013-07-16 DIAGNOSIS — Z01812 Encounter for preprocedural laboratory examination: Secondary | ICD-10-CM | POA: Insufficient documentation

## 2013-07-16 HISTORY — DX: Type 2 diabetes mellitus without complications: E11.9

## 2013-07-16 HISTORY — DX: Shortness of breath: R06.02

## 2013-07-16 HISTORY — DX: Personal history of other infectious and parasitic diseases: Z86.19

## 2013-07-16 HISTORY — DX: Inflammatory liver disease, unspecified: K75.9

## 2013-07-16 LAB — CBC WITH DIFFERENTIAL/PLATELET
BASOS ABS: 0 10*3/uL (ref 0.0–0.1)
BASOS PCT: 0 % (ref 0–1)
EOS ABS: 1.4 10*3/uL — AB (ref 0.0–0.7)
Eosinophils Relative: 13 % — ABNORMAL HIGH (ref 0–5)
HCT: 41.1 % (ref 36.0–46.0)
Hemoglobin: 14 g/dL (ref 12.0–15.0)
LYMPHS PCT: 36 % (ref 12–46)
Lymphs Abs: 3.7 10*3/uL (ref 0.7–4.0)
MCH: 31.2 pg (ref 26.0–34.0)
MCHC: 34.1 g/dL (ref 30.0–36.0)
MCV: 91.5 fL (ref 78.0–100.0)
Monocytes Absolute: 0.8 10*3/uL (ref 0.1–1.0)
Monocytes Relative: 8 % (ref 3–12)
Neutro Abs: 4.5 10*3/uL (ref 1.7–7.7)
Neutrophils Relative %: 43 % (ref 43–77)
PLATELETS: 204 10*3/uL (ref 150–400)
RBC: 4.49 MIL/uL (ref 3.87–5.11)
RDW: 14.4 % (ref 11.5–15.5)
WBC: 10.5 10*3/uL (ref 4.0–10.5)

## 2013-07-16 LAB — BASIC METABOLIC PANEL
BUN: 21 mg/dL (ref 6–23)
CALCIUM: 9.6 mg/dL (ref 8.4–10.5)
CO2: 24 mEq/L (ref 19–32)
Chloride: 104 mEq/L (ref 96–112)
Creatinine, Ser: 0.74 mg/dL (ref 0.50–1.10)
GFR calc Af Amer: >90 mL/min (ref >90)
GFR calc non Af Amer: 85 mL/min — ABNORMAL LOW (ref >90)
Glucose, Bld: 95 mg/dL (ref 70–99)
Potassium: 4.5 mEq/L (ref 3.7–5.3)
Sodium: 143 mEq/L (ref 137–147)

## 2013-07-16 LAB — SURGICAL PCR SCREEN
MRSA, PCR: NEGATIVE
Staphylococcus aureus: NEGATIVE

## 2013-07-16 NOTE — Pre-Procedure Instructions (Signed)
[name]  07/16/2013   Your procedure is scheduled on:  Friday, April 10 @ 0745  Report to [facility] Entrance "A" Admitting at 0530 AM.  Call this number if you have problems the morning of surgery: [phone]   Remember:   Do not eat food or drink liquids after midnight.Thursday night   Take these medicines the morning of surgery with A SIP OF WATER: none   Do not wear jewelry, make-up or nail polish.  Do not wear lotions, powders, or perfumes. You may wear deodorant.  Do not shave 48 hours prior to surgery. Men may shave face and neck.  Do not bring valuables to the hospital.  [facility] is not responsible   for any belongings or valuables.               Contacts, dentures or bridgework may not be worn into surgery.  Leave suitcase in the car. After surgery it may be brought to your room.  For patients admitted to the hospital, discharge time is determined by your    treatment team.               Patients discharged the day of surgery will not be allowed to drive  home.  Name and phone number of your driver:       Special Instructions: [place] - Preparing for Surgery  Before surgery, you can play an important role.  Because skin is not sterile, your skin needs to be as free of germs as possible.  You can reduce the number of germs on you skin by washing with CHG (chlorahexidine gluconate) soap before surgery.  CHG is an antiseptic cleaner which kills germs and bonds with the skin to continue killing germs even after washing.  Please DO NOT use if you have an allergy to CHG or antibacterial soaps.  If your skin becomes reddened/irritated stop using the CHG and inform your nurse when you arrive at Short Stay.  Do not shave (including legs and underarms) for at least 48 hours prior to the first CHG shower.  You may shave your face.  Please follow these instructions carefully:   1.  Shower with CHG Soap the night before surgery and the  morning of Surgery.  2.   If you choose to wash your hair, wash your hair first as usual with your  normal shampoo.  3.  After you shampoo, rinse your hair and body thoroughly to remove the  Shampoo.  4.  Use CHG as you would any other liquid soap.  You can apply chg directly       to the skin and wash gently with scrungie or a clean washcloth.  5.  Apply the CHG Soap to your body ONLY FROM THE NECK DOWN.  Do not use on open wounds or open sores.  Avoid contact with your eyes,       ears, mouth and genitals (private parts).  Wash genitals (private parts)   with your normal soap.  6.  Wash thoroughly, paying special attention to the area where your surgery   will be performed.  7.  Thoroughly rinse your body with warm water from the neck down.  8.  DO NOT shower/wash with your normal soap after using and rinsing off    the CHG Soap.  9.  Pat yourself dry with a clean towel.            10.  Wear clean pajamas.  11.  Place clean sheets on your bed the night of your first shower and do not  sleep with pets.  Day of Surgery  Do not apply any lotions/deoderants the morning of surgery.  Please wear clean clothes to the hospital/surgery center.     Please read over the following fact sheets that you were given: Pain Booklet, Coughing and Deep Breathing and Surgical Site Infection Prevention

## 2013-07-16 NOTE — Progress Notes (Signed)
STOP-Bang score 4. Results sent to PCP, Dr Shirlean Mylararol Webb @ [address]

## 2013-07-19 MED ORDER — CEFAZOLIN SODIUM-DEXTROSE 2-3 GM-% IV SOLR
2.0000 g | INTRAVENOUS | Status: AC
  Administered 2013-07-20: 2 g via INTRAVENOUS
  Filled 2013-07-19: qty 50

## 2013-07-19 MED ORDER — DEXAMETHASONE SODIUM PHOSPHATE 10 MG/ML IJ SOLN
10.0000 mg | INTRAMUSCULAR | Status: AC
Start: 2013-07-20 — End: 2013-07-20
  Administered 2013-07-20: 10 mg via INTRAVENOUS
  Filled 2013-07-19: qty 1

## 2013-07-20 ENCOUNTER — Inpatient Hospital Stay (HOSPITAL_COMMUNITY)
Admission: RE | Admit: 2013-07-20 | Discharge: 2013-07-21 | DRG: 520 | Disposition: A | Discharge status: Home / Self Care | Payer: Medicare Other | Source: Ambulatory Visit | Attending: Neurosurgery | Admitting: Neurosurgery

## 2013-07-20 ENCOUNTER — Encounter (HOSPITAL_COMMUNITY): Payer: Self-pay | Admitting: Certified Registered Nurse Anesthetist

## 2013-07-20 ENCOUNTER — Inpatient Hospital Stay (HOSPITAL_COMMUNITY): Payer: Medicare Other | Admitting: Certified Registered Nurse Anesthetist

## 2013-07-20 ENCOUNTER — Encounter (HOSPITAL_COMMUNITY)
Admission: RE | Disposition: A | Discharge status: Home / Self Care | Payer: Self-pay | Source: Ambulatory Visit | Attending: Neurosurgery

## 2013-07-20 ENCOUNTER — Encounter (HOSPITAL_COMMUNITY): Payer: Medicare Other | Admitting: Certified Registered Nurse Anesthetist

## 2013-07-20 ENCOUNTER — Inpatient Hospital Stay (HOSPITAL_COMMUNITY): Payer: Medicare Other

## 2013-07-20 DIAGNOSIS — Z96659 Presence of unspecified artificial knee joint: Secondary | ICD-10-CM

## 2013-07-20 DIAGNOSIS — Z8249 Family history of ischemic heart disease and other diseases of the circulatory system: Secondary | ICD-10-CM

## 2013-07-20 DIAGNOSIS — K759 Inflammatory liver disease, unspecified: Secondary | ICD-10-CM | POA: Diagnosis present

## 2013-07-20 DIAGNOSIS — I10 Essential (primary) hypertension: Secondary | ICD-10-CM | POA: Diagnosis present

## 2013-07-20 DIAGNOSIS — Z823 Family history of stroke: Secondary | ICD-10-CM

## 2013-07-20 DIAGNOSIS — M7130 Other bursal cyst, unspecified site: Secondary | ICD-10-CM | POA: Diagnosis present

## 2013-07-20 DIAGNOSIS — Z7982 Long term (current) use of aspirin: Secondary | ICD-10-CM

## 2013-07-20 DIAGNOSIS — M7138 Other bursal cyst, other site: Secondary | ICD-10-CM | POA: Diagnosis present

## 2013-07-20 DIAGNOSIS — H9190 Unspecified hearing loss, unspecified ear: Secondary | ICD-10-CM | POA: Diagnosis present

## 2013-07-20 DIAGNOSIS — M47817 Spondylosis without myelopathy or radiculopathy, lumbosacral region: Principal | ICD-10-CM | POA: Diagnosis present

## 2013-07-20 DIAGNOSIS — E669 Obesity, unspecified: Secondary | ICD-10-CM | POA: Diagnosis present

## 2013-07-20 DIAGNOSIS — E119 Type 2 diabetes mellitus without complications: Secondary | ICD-10-CM | POA: Diagnosis present

## 2013-07-20 DIAGNOSIS — K5730 Diverticulosis of large intestine without perforation or abscess without bleeding: Secondary | ICD-10-CM | POA: Diagnosis present

## 2013-07-20 HISTORY — PX: LUMBAR LAMINECTOMY/DECOMPRESSION MICRODISCECTOMY: SHX5026

## 2013-07-20 LAB — GLUCOSE, CAPILLARY
Glucose-Capillary: 104 mg/dL — ABNORMAL HIGH (ref 70–99)
Glucose-Capillary: 153 mg/dL — ABNORMAL HIGH (ref 70–99)
Glucose-Capillary: 165 mg/dL — ABNORMAL HIGH (ref 70–99)
Glucose-Capillary: 118 mg/dL — ABNORMAL HIGH (ref 70–99)
Glucose-Capillary: 166 mg/dL — ABNORMAL HIGH (ref 70–99)

## 2013-07-20 SURGERY — LUMBAR LAMINECTOMY/DECOMPRESSION MICRODISCECTOMY 1 LEVEL
Anesthesia: General | Site: Back | Laterality: Right

## 2013-07-20 MED ORDER — ALBUMIN HUMAN 5 % IV SOLN
INTRAVENOUS | Status: DC | PRN
  Administered 2013-07-20: 09:00:00 via INTRAVENOUS

## 2013-07-20 MED ORDER — PHENYLEPHRINE HCL 10 MG/ML IJ SOLN
INTRAMUSCULAR | Status: DC | PRN
  Administered 2013-07-20: 80 ug via INTRAVENOUS

## 2013-07-20 MED ORDER — ROCURONIUM BROMIDE 100 MG/10ML IV SOLN
INTRAVENOUS | Status: DC | PRN
  Administered 2013-07-20: 30 mg via INTRAVENOUS

## 2013-07-20 MED ORDER — FENTANYL CITRATE 0.05 MG/ML IJ SOLN
INTRAMUSCULAR | Status: DC | PRN
  Administered 2013-07-20: 50 ug via INTRAVENOUS
  Administered 2013-07-20: 100 ug via INTRAVENOUS
  Administered 2013-07-20: 50 ug via INTRAVENOUS

## 2013-07-20 MED ORDER — THROMBIN 5000 UNITS EX SOLR
CUTANEOUS | Status: DC | PRN
  Administered 2013-07-20 (×2): 5000 [IU] via TOPICAL

## 2013-07-20 MED ORDER — 0.9 % SODIUM CHLORIDE (POUR BTL) OPTIME
TOPICAL | Status: DC | PRN
  Administered 2013-07-20: 1000 mL

## 2013-07-20 MED ORDER — VERAPAMIL HCL ER 240 MG PO TBCR
240.0000 mg | EXTENDED_RELEASE_TABLET | Freq: Every day | ORAL | Status: DC
  Administered 2013-07-21: 240 mg via ORAL
  Filled 2013-07-20 (×2): qty 1

## 2013-07-20 MED ORDER — NEOSTIGMINE METHYLSULFATE 1 MG/ML IJ SOLN
INTRAMUSCULAR | Status: DC | PRN
  Administered 2013-07-20: 5 mg via INTRAVENOUS

## 2013-07-20 MED ORDER — GLYCOPYRROLATE 0.2 MG/ML IJ SOLN
INTRAMUSCULAR | Status: AC
  Filled 2013-07-20: qty 4

## 2013-07-20 MED ORDER — HYDROMORPHONE HCL PF 1 MG/ML IJ SOLN: 0.5000 mg | INTRAMUSCULAR | Status: DC | PRN

## 2013-07-20 MED ORDER — SENNA 8.6 MG PO TABS
1.0000 | ORAL_TABLET | Freq: Two times a day (BID) | ORAL | Status: DC
  Administered 2013-07-20 – 2013-07-21 (×2): 8.6 mg via ORAL
  Filled 2013-07-20 (×3): qty 1

## 2013-07-20 MED ORDER — ACETAMINOPHEN 325 MG PO TABS: 650.0000 mg | ORAL_TABLET | ORAL | Status: DC | PRN

## 2013-07-20 MED ORDER — CEFAZOLIN SODIUM 1-5 GM-% IV SOLN
1.0000 g | Freq: Three times a day (TID) | INTRAVENOUS | Status: AC
  Administered 2013-07-20 (×2): 1 g via INTRAVENOUS
  Filled 2013-07-20 (×2): qty 50

## 2013-07-20 MED ORDER — SODIUM CHLORIDE 0.9 % IJ SOLN: 3.0000 mL | INTRAMUSCULAR | Status: DC | PRN

## 2013-07-20 MED ORDER — PHENYLEPHRINE 40 MCG/ML (10ML) SYRINGE FOR IV PUSH (FOR BLOOD PRESSURE SUPPORT)
PREFILLED_SYRINGE | INTRAVENOUS | Status: AC
  Filled 2013-07-20: qty 10

## 2013-07-20 MED ORDER — FENTANYL CITRATE 0.05 MG/ML IJ SOLN
INTRAMUSCULAR | Status: AC
  Filled 2013-07-20: qty 5

## 2013-07-20 MED ORDER — ONDANSETRON HCL 4 MG/2ML IJ SOLN
INTRAMUSCULAR | Status: DC | PRN
Start: 2013-07-20 — End: 2013-07-20
  Administered 2013-07-20: 4 mg via INTRAVENOUS

## 2013-07-20 MED ORDER — SODIUM CHLORIDE 0.9 % IJ SOLN
3.0000 mL | Freq: Two times a day (BID) | INTRAMUSCULAR | Status: DC
  Administered 2013-07-20 (×2): 3 mL via INTRAVENOUS

## 2013-07-20 MED ORDER — MIDAZOLAM HCL 2 MG/2ML IJ SOLN
INTRAMUSCULAR | Status: AC
  Filled 2013-07-20: qty 2

## 2013-07-20 MED ORDER — ALUM & MAG HYDROXIDE-SIMETH 200-200-20 MG/5ML PO SUSP: 30.0000 mL | Freq: Four times a day (QID) | ORAL | Status: DC | PRN

## 2013-07-20 MED ORDER — HYDROMORPHONE HCL PF 1 MG/ML IJ SOLN
INTRAMUSCULAR | Status: AC
  Filled 2013-07-20: qty 1

## 2013-07-20 MED ORDER — ONDANSETRON HCL 4 MG/2ML IJ SOLN: 4.0000 mg | INTRAMUSCULAR | Status: DC | PRN

## 2013-07-20 MED ORDER — GLYCOPYRROLATE 0.2 MG/ML IJ SOLN
INTRAMUSCULAR | Status: DC | PRN
  Administered 2013-07-20: .8 mg via INTRAVENOUS

## 2013-07-20 MED ORDER — ARTIFICIAL TEARS OP OINT
TOPICAL_OINTMENT | OPHTHALMIC | Status: AC
  Filled 2013-07-20: qty 3.5

## 2013-07-20 MED ORDER — ONDANSETRON HCL 4 MG/2ML IJ SOLN
INTRAMUSCULAR | Status: AC
  Filled 2013-07-20: qty 2

## 2013-07-20 MED ORDER — METOCLOPRAMIDE HCL 5 MG/ML IJ SOLN
INTRAMUSCULAR | Status: AC
  Filled 2013-07-20: qty 2

## 2013-07-20 MED ORDER — HYDROCODONE-ACETAMINOPHEN 5-325 MG PO TABS: 1.0000 | ORAL_TABLET | ORAL | Status: DC | PRN

## 2013-07-20 MED ORDER — LIDOCAINE HCL (CARDIAC) 20 MG/ML IV SOLN
INTRAVENOUS | Status: DC | PRN
  Administered 2013-07-20: 100 mg via INTRAVENOUS

## 2013-07-20 MED ORDER — OXYCODONE-ACETAMINOPHEN 5-325 MG PO TABS: 1.0000 | ORAL_TABLET | ORAL | Status: DC | PRN

## 2013-07-20 MED ORDER — METOCLOPRAMIDE HCL 5 MG/ML IJ SOLN
5.0000 mg | Freq: Once | INTRAMUSCULAR | Status: AC
  Administered 2013-07-20: 5 mg via INTRAVENOUS

## 2013-07-20 MED ORDER — ARTIFICIAL TEARS OP OINT
TOPICAL_OINTMENT | OPHTHALMIC | Status: DC | PRN
  Administered 2013-07-20: 1 via OPHTHALMIC

## 2013-07-20 MED ORDER — CYCLOBENZAPRINE HCL 10 MG PO TABS: 10.0000 mg | ORAL_TABLET | Freq: Three times a day (TID) | ORAL | Status: DC | PRN

## 2013-07-20 MED ORDER — PHENOL 1.4 % MT LIQD: 1.0000 | OROMUCOSAL | Status: DC | PRN

## 2013-07-20 MED ORDER — KETOROLAC TROMETHAMINE 30 MG/ML IJ SOLN
30.0000 mg | Freq: Four times a day (QID) | INTRAMUSCULAR | Status: DC
  Administered 2013-07-20 – 2013-07-21 (×3): 30 mg via INTRAVENOUS
  Filled 2013-07-20 (×7): qty 1

## 2013-07-20 MED ORDER — PROPOFOL 10 MG/ML IV BOLUS
INTRAVENOUS | Status: DC | PRN
  Administered 2013-07-20: 150 mg via INTRAVENOUS
  Administered 2013-07-20: 50 mg via INTRAVENOUS

## 2013-07-20 MED ORDER — MENTHOL 3 MG MT LOZG: 1.0000 | LOZENGE | OROMUCOSAL | Status: DC | PRN

## 2013-07-20 MED ORDER — KETOROLAC TROMETHAMINE 30 MG/ML IJ SOLN
INTRAMUSCULAR | Status: DC | PRN
  Administered 2013-07-20: 30 mg via INTRAVENOUS

## 2013-07-20 MED ORDER — NEOSTIGMINE METHYLSULFATE 1 MG/ML IJ SOLN
INTRAMUSCULAR | Status: AC
  Filled 2013-07-20: qty 10

## 2013-07-20 MED ORDER — SUCCINYLCHOLINE CHLORIDE 20 MG/ML IJ SOLN
INTRAMUSCULAR | Status: DC | PRN
  Administered 2013-07-20: 100 mg via INTRAVENOUS

## 2013-07-20 MED ORDER — HEMOSTATIC AGENTS (NO CHARGE) OPTIME
TOPICAL | Status: DC | PRN
  Administered 2013-07-20: 1 via TOPICAL

## 2013-07-20 MED ORDER — METFORMIN HCL 500 MG PO TABS
500.0000 mg | ORAL_TABLET | Freq: Every day | ORAL | Status: DC
  Administered 2013-07-21: 500 mg via ORAL
  Filled 2013-07-20 (×2): qty 1

## 2013-07-20 MED ORDER — VERAPAMIL HCL 240 MG PO CP24: 240.0000 mg | ORAL_CAPSULE | Freq: Every day | ORAL | Status: DC

## 2013-07-20 MED ORDER — LACTATED RINGERS IV SOLN
INTRAVENOUS | Status: DC | PRN
  Administered 2013-07-20 (×2): via INTRAVENOUS

## 2013-07-20 MED ORDER — LIDOCAINE HCL (CARDIAC) 20 MG/ML IV SOLN
INTRAVENOUS | Status: AC
  Filled 2013-07-20: qty 5

## 2013-07-20 MED ORDER — ROCURONIUM BROMIDE 50 MG/5ML IV SOLN
INTRAVENOUS | Status: AC
  Filled 2013-07-20: qty 1

## 2013-07-20 MED ORDER — PROPOFOL 10 MG/ML IV BOLUS
INTRAVENOUS | Status: AC
  Filled 2013-07-20: qty 20

## 2013-07-20 MED ORDER — KETOROLAC TROMETHAMINE 30 MG/ML IJ SOLN
INTRAMUSCULAR | Status: AC
  Filled 2013-07-20: qty 1

## 2013-07-20 MED ORDER — HYDROMORPHONE HCL PF 1 MG/ML IJ SOLN
0.2500 mg | INTRAMUSCULAR | Status: DC | PRN
  Administered 2013-07-20 (×2): 0.25 mg via INTRAVENOUS
  Administered 2013-07-20: 0.5 mg via INTRAVENOUS

## 2013-07-20 MED ORDER — SODIUM CHLORIDE 0.9 % IR SOLN
Status: DC | PRN
  Administered 2013-07-20: 07:00:00

## 2013-07-20 MED ORDER — MIDAZOLAM HCL 5 MG/5ML IJ SOLN
INTRAMUSCULAR | Status: DC | PRN
  Administered 2013-07-20: 2 mg via INTRAVENOUS

## 2013-07-20 MED ORDER — ASPIRIN 81 MG PO TABS
162.0000 mg | ORAL_TABLET | Freq: Every day | ORAL | Status: DC
  Administered 2013-07-20 – 2013-07-21 (×2): 162 mg via ORAL
  Filled 2013-07-20 (×2): qty 2

## 2013-07-20 MED ORDER — ACETAMINOPHEN 650 MG RE SUPP: 650.0000 mg | RECTAL | Status: DC | PRN

## 2013-07-20 MED ORDER — LISINOPRIL 20 MG PO TABS
20.0000 mg | ORAL_TABLET | Freq: Every day | ORAL | Status: DC
  Administered 2013-07-20 – 2013-07-21 (×2): 20 mg via ORAL
  Filled 2013-07-20 (×2): qty 1

## 2013-07-20 SURGICAL SUPPLY — 56 items
ADH SKN CLS APL DERMABOND .7 (GAUZE/BANDAGES/DRESSINGS) ×1
APL SKNCLS STERI-STRIP NONHPOA (GAUZE/BANDAGES/DRESSINGS) ×1
BAG DECANTER FOR FLEXI CONT (MISCELLANEOUS) ×2 IMPLANT
BENZOIN TINCTURE PRP APPL 2/3 (GAUZE/BANDAGES/DRESSINGS) ×2 IMPLANT
BLADE SURG ROTATE 9660 (MISCELLANEOUS) IMPLANT
BRUSH SCRUB EZ PLAIN DRY (MISCELLANEOUS) ×2 IMPLANT
BUR CUTTER 7.0 ROUND (BURR) ×2 IMPLANT
CANISTER SUCT 3000ML (MISCELLANEOUS) ×2 IMPLANT
CONT SPEC 4OZ CLIKSEAL STRL BL (MISCELLANEOUS) ×2 IMPLANT
DECANTER SPIKE VIAL GLASS SM (MISCELLANEOUS) ×2 IMPLANT
DERMABOND ADVANCED (GAUZE/BANDAGES/DRESSINGS) ×1
DERMABOND ADVANCED .7 DNX12 (GAUZE/BANDAGES/DRESSINGS) ×1 IMPLANT
DRAPE LAPAROTOMY 100X72X124 (DRAPES) ×2 IMPLANT
DRAPE MICROSCOPE LEICA (MISCELLANEOUS) ×1 IMPLANT
DRAPE MICROSCOPE ZEISS OPMI (DRAPES) ×1 IMPLANT
DRAPE POUCH INSTRU U-SHP 10X18 (DRAPES) ×2 IMPLANT
DRAPE PROXIMA HALF (DRAPES) IMPLANT
DRAPE SURG 17X23 STRL (DRAPES) ×4 IMPLANT
DRSG OPSITE POSTOP 4X6 (GAUZE/BANDAGES/DRESSINGS) ×1 IMPLANT
DURAPREP 26ML APPLICATOR (WOUND CARE) ×2 IMPLANT
ELECT REM PT RETURN 9FT ADLT (ELECTROSURGICAL) ×2
ELECTRODE REM PT RTRN 9FT ADLT (ELECTROSURGICAL) ×1 IMPLANT
EVACUATOR 1/8 PVC DRAIN (DRAIN) ×1 IMPLANT
GAUZE SPONGE 4X4 16PLY XRAY LF (GAUZE/BANDAGES/DRESSINGS) IMPLANT
GLOVE ECLIPSE 8.5 STRL (GLOVE) ×2 IMPLANT
GLOVE EXAM NITRILE LRG STRL (GLOVE) IMPLANT
GLOVE EXAM NITRILE MD LF STRL (GLOVE) IMPLANT
GLOVE EXAM NITRILE XL STR (GLOVE) IMPLANT
GLOVE EXAM NITRILE XS STR PU (GLOVE) IMPLANT
GLOVE INDICATOR 7.0 STRL GRN (GLOVE) ×1 IMPLANT
GLOVE SURG SS PI 7.0 STRL IVOR (GLOVE) ×3 IMPLANT
GOWN BRE IMP SLV AUR LG STRL (GOWN DISPOSABLE) IMPLANT
GOWN BRE IMP SLV AUR XL STRL (GOWN DISPOSABLE) ×1 IMPLANT
GOWN STRL REIN 2XL LVL4 (GOWN DISPOSABLE) IMPLANT
GOWN STRL REUS W/ TWL LRG LVL3 (GOWN DISPOSABLE) IMPLANT
GOWN STRL REUS W/ TWL XL LVL3 (GOWN DISPOSABLE) IMPLANT
GOWN STRL REUS W/TWL LRG LVL3 (GOWN DISPOSABLE) ×2
GOWN STRL REUS W/TWL XL LVL3 (GOWN DISPOSABLE) ×2
KIT BASIN OR (CUSTOM PROCEDURE TRAY) ×2 IMPLANT
KIT ROOM TURNOVER OR (KITS) ×2 IMPLANT
NDL SPNL 22GX3.5 QUINCKE BK (NEEDLE) ×1 IMPLANT
NEEDLE HYPO 22GX1.5 SAFETY (NEEDLE) ×2 IMPLANT
NEEDLE SPNL 22GX3.5 QUINCKE BK (NEEDLE) ×2 IMPLANT
NS IRRIG 1000ML POUR BTL (IV SOLUTION) ×2 IMPLANT
PACK LAMINECTOMY NEURO (CUSTOM PROCEDURE TRAY) ×2 IMPLANT
PAD ARMBOARD 7.5X6 YLW CONV (MISCELLANEOUS) ×6 IMPLANT
RUBBERBAND STERILE (MISCELLANEOUS) ×4 IMPLANT
SPONGE GAUZE 4X4 12PLY (GAUZE/BANDAGES/DRESSINGS) ×2 IMPLANT
SPONGE SURGIFOAM ABS GEL SZ50 (HEMOSTASIS) ×2 IMPLANT
STRIP CLOSURE SKIN 1/2X4 (GAUZE/BANDAGES/DRESSINGS) ×2 IMPLANT
SUT VIC AB 2-0 CT1 18 (SUTURE) ×2 IMPLANT
SUT VIC AB 3-0 SH 8-18 (SUTURE) ×2 IMPLANT
SYR 20ML ECCENTRIC (SYRINGE) ×2 IMPLANT
TOWEL OR 17X24 6PK STRL BLUE (TOWEL DISPOSABLE) ×2 IMPLANT
TOWEL OR 17X26 10 PK STRL BLUE (TOWEL DISPOSABLE) ×2 IMPLANT
WATER STERILE IRR 1000ML POUR (IV SOLUTION) ×2 IMPLANT

## 2013-07-20 NOTE — Op Note (Signed)
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 Microdissection.   Surgeon:Dametra Whetsel A.Shequila Neglia, M.D.  Asst. Surgeon: None  Anesthesia: General  Indication: 68 year old female with severe right L4 radicular pain failing conservative management. Workup demonstrates evidence of spondylosis and associated synovial cyst on the right side at L4-5 causing marked compression the right-sided L4 nerve root as it begins to exit the foramen. Patient has failed conservative management and presents now for decompressive surgery including resection of her synovial cyst.  Operative note: After induction anesthesia, patient positioned prone onto Wilson frame and appropriately padded. Lumbar region prepped and draped. Incision made overlying L4-5. Dissection performed the right side. Retractor placed. X-ray taken. Level confirmed. Laminotomy performed using high-speed drill and Kerrison rongeurs to remove the inferior aspect of lamina of L4 medial aspect the L4-5 facet joint and the superior rim of the L5 lamina. Ligament flavum was elevated and resected thecal fashion. Underlying thecal sac and L5 nerve or were notified. Marked and brought field these might dissection the spinal canal. Epidural venous plexus was quite limited and cut. The facet joint was further undercut using Kerrison rongeurs. The synovial cyst and significant spondylitic protrusion was encountered where was compressing the L4 nerve root. This is dissected free using microdissection and resected. At this point the L4 nerve root was fully decompressed. There is no evidence of injury to thecal sac and nerve roots. Wound is then irrigated with and bike  solution. Gelfoam was placed topically for hemostasis which is found to be good. Wound is irrigated one final time. Wounds and close in layers. A medium Hemovac drain was left at per space secondary the patient's morbid obesity. Steri-Strips and sterile dressing were applied. There were no apparent complications. Patient tolerated procedure well and she returns to the recovery postop.

## 2013-07-20 NOTE — Transfer of Care (Signed)
Immediate Anesthesia Transfer of Care Note  Patient: [name]  Procedure(s) Performed: Procedure(s): LUMBAR LAMINECTOMY/DECOMPRESSION MICRODISCECTOMY RIGHT LUMBAR FOUR-FIVE (Right)  Patient Location: PACU  Anesthesia Type:General  Level of Consciousness: awake and alert   Airway & Oxygen Therapy: Patient Spontanous Breathing and Patient connected to face mask oxygen  Post-op Assessment: Report given to PACU RN, Post -op Vital signs reviewed and stable and Patient moving all extremities X 4  Post vital signs: Reviewed and stable  Complications: No apparent anesthesia complications

## 2013-07-20 NOTE — Progress Notes (Signed)
Patient still very lethargic, is able to arrouse and have a conversation but immediately falls right back to sleep. Dr. Randa EvensEdwards was notified of patient's condition and states patient to stay in PACU until more awake due to the fact she was able to carry on long conversations and very alert. VSS. Patient complains of no pain, resting comfortably.  Will continue to monitor.

## 2013-07-20 NOTE — Anesthesia Preprocedure Evaluation (Addendum)
Anesthesia Evaluation  Patient identified by MRN, date of birth, ID band Patient awake    Reviewed: Allergy & Precautions, H&P , NPO status , Patient's Chart, lab work & pertinent test results  Airway Mallampati: II TM Distance: >3 FB Neck ROM: Full    Dental  (+) Dental Advisory Given, Missing, Poor Dentition,    Pulmonary shortness of breath and with exertion,  breath sounds clear to auscultation        Cardiovascular hypertension, Pt. on medications Rhythm:Regular Rate:Normal     Neuro/Psych    GI/Hepatic negative GI ROS, (+) Hepatitis -  Endo/Other  diabetes, Type 2, Oral Hypoglycemic AgentsMorbid obesity  Renal/GU      Musculoskeletal  (+) Arthritis -, Osteoarthritis,    Abdominal   Peds  Hematology   Anesthesia Other Findings   Reproductive/Obstetrics                        Anesthesia Physical Anesthesia Plan  ASA: III  Anesthesia Plan: General   Post-op Pain Management:    Induction: Intravenous  Airway Management Planned: Oral ETT  Additional Equipment:   Intra-op Plan:   Post-operative Plan: Extubation in OR  Informed Consent: I have reviewed the patients History and Physical, chart, labs and discussed the procedure including the risks, benefits and alternatives for the proposed anesthesia with the patient or authorized representative who has indicated his/her understanding and acceptance.   Dental advisory given  Plan Discussed with: CRNA, Anesthesiologist and Surgeon  Anesthesia Plan Comments:         Anesthesia Quick Evaluation

## 2013-07-20 NOTE — Plan of Care (Signed)
Problem: Consults Goal: Diagnosis - Spinal Surgery Outcome: Completed/Met Date Met:  07/20/13 Lumbar Laminectomy (Complex)

## 2013-07-20 NOTE — Anesthesia Procedure Notes (Signed)
Procedure Name: Intubation Date/Time: 07/20/2013 7:49 AM Performed by: Elberta LeatherwoodURNER, Kylil Swopes E Pre-anesthesia Checklist: Emergency Drugs available, Suction available, Patient being monitored and Patient identified Patient Re-evaluated:Patient Re-evaluated prior to inductionOxygen Delivery Method: Circle system utilized Preoxygenation: Pre-oxygenation with 100% oxygen Intubation Type: IV induction and Rapid sequence Ventilation: Mask ventilation without difficulty Laryngoscope Size: Miller and 2 Grade View: Grade I Tube type: Oral Tube size: 7.5 mm Number of attempts: 1 Airway Equipment and Method: Stylet Placement Confirmation: ETT inserted through vocal cords under direct vision,  positive ETCO2 and breath sounds checked- equal and bilateral Secured at: 22 cm Tube secured with: Tape Dental Injury: Teeth and Oropharynx as per pre-operative assessment

## 2013-07-20 NOTE — Brief Op Note (Signed)
07/20/2013  9:40 AM  PATIENT:  [name]  68 y.o. female  PRE-OPERATIVE DIAGNOSIS:  spondylosis  POST-OPERATIVE DIAGNOSIS:  spondylosis  PROCEDURE:  Procedure(s): LUMBAR LAMINECTOMY/DECOMPRESSION MICRODISCECTOMY RIGHT LUMBAR FOUR-FIVE (Right)  SURGEON:  Surgeon(s) and Role:    * Temple PaciniHenry A Veto Macqueen, MD - Primary  PHYSICIAN ASSISTANT:   ASSISTANTS:    ANESTHESIA:   general  EBL:  Total I/O In: 1550 [I.V.:1300; IV Piggyback:250] Out: 250 [Blood:250]  BLOOD ADMINISTERED:none  DRAINS: (Medium) Hemovact drain(s) in the Epidural space with  Suction Open   LOCAL MEDICATIONS USED:  MARCAINE     SPECIMEN:  No Specimen  DISPOSITION OF SPECIMEN:  N/A  COUNTS:  YES  TOURNIQUET:  * No tourniquets in log *  DICTATION: .Dragon Dictation  PLAN OF CARE: Admit to inpatient   PATIENT DISPOSITION:  PACU - hemodynamically stable.   Delay start of Pharmacological VTE agent (>24hrs) due to surgical blood loss or risk of bleeding: yes

## 2013-07-20 NOTE — Anesthesia Postprocedure Evaluation (Signed)
  Anesthesia Post-op Note  Patient: [name]  Procedure(s) Performed: Procedure(s): LUMBAR LAMINECTOMY/DECOMPRESSION MICRODISCECTOMY RIGHT LUMBAR FOUR-FIVE (Right)  Patient Location: PACU  Anesthesia Type:General  Level of Consciousness: awake  Airway and Oxygen Therapy: Patient Spontanous Breathing  Post-op Pain: mild  Post-op Assessment: Post-op Vital signs reviewed  Post-op Vital Signs: Reviewed  Last Vitals:  Filed Vitals:   07/20/13 0945  BP: 123/62  Pulse: 72  Temp:   Resp: 20    Complications: No apparent anesthesia complications

## 2013-07-20 NOTE — H&P (Signed)
[name] is an 68 y.o. female.   Chief Complaint: Right thigh pain HPI: 68 year old female with intractable back and right anterior thigh radicular pain failing conservative management her workup demonstrates evidence of a right-sided L4-5 synovial cyst with compression of the right-sided L4 nerve root as it exits the canal. Patient presents now for decompressive surgery in hopes of improving her symptoms.  Past Medical History  Diagnosis Date  . Hypertension   . Diverticulosis   . Menopause   . 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 Obesity   . Hearing loss   . Prediabetes   . Gout   . Cholelithiasis   . Candida infection     fingernails  . Diabetes mellitus without complication 06/2013    Type 2 NIDDM x 1 month  . Shortness of breath     with exertion  . Hepatitis   . H/O hepatitis 2011    viral    Past Surgical History  Procedure Laterality Date  . Abdominal hysterectomy    . Replacement total knee bilateral      2007/2009  . Joint replacement      Family History  Problem Relation Age of Onset  . Heart disease Mother 2764    heart attack  . Stroke Father 4064   Social History:  reports that she has never smoked. She does not have any smokeless tobacco history on file. She reports that she drinks alcohol. She reports that she does not use illicit drugs.  Allergies: No Known Allergies  Medications Prior to Admission  Medication Sig Dispense Refill  . aspirin 81 MG tablet Take 162 mg by mouth daily.       . Calcium Carbonate-Vitamin D (CALCIUM + D PO) Take 1 tablet by mouth 3 (three) times a week.       . clotrimazole-betamethasone (LOTRISONE) cream Apply 1 application topically daily as needed (for rash).       . fish oil-omega-3 fatty acids 1000 MG capsule Take 1 g by mouth daily.        Marland Kitchen. lisinopril (PRINIVIL,ZESTRIL) 20 MG tablet Take 20 mg by mouth daily.        . metFORMIN (GLUCOPHAGE) 500 MG tablet Take 500 mg by mouth daily with breakfast.      .  Multiple Vitamin (MULTIVITAMIN PO) Take by mouth daily.        . naproxen sodium (ANAPROX) 220 MG tablet Take 440 mg by mouth 2 (two) times daily with a meal.      . verapamil (VERELAN PM) 240 MG 24 hr capsule Take 240 mg by mouth daily with breakfast.         Results for orders placed during the hospital encounter of 07/20/13 (from the past 48 hour(s))  GLUCOSE, CAPILLARY     Status: Abnormal   Collection Time    07/20/13  6:08 AM      Result Value Ref Range   Glucose-Capillary 104 (*) 70 - 99 mg/dL   No results found.  Review of Systems  Constitutional: Negative.   HENT: Negative.   Eyes: Negative.   Respiratory: Negative.   Cardiovascular: Negative.   Gastrointestinal: Negative.   Genitourinary: Negative.   Musculoskeletal: Negative.   Skin: Negative.   Neurological: Negative.   Endo/Heme/Allergies: Negative.   Psychiatric/Behavioral: Negative.     Blood pressure 171/62, pulse 73, temperature 97.9 F (36.6 C), temperature source Oral, resp. rate 18, weight 118.389 kg (261 lb), SpO2 97.00%. Physical Exam  Constitutional: She is oriented to person, place, and time. She appears well-developed and well-nourished. No distress.  HENT:  Head: Normocephalic and atraumatic.  Right Ear: External ear normal.  Left Ear: External ear normal.  Nose: Nose normal.  Mouth/Throat: Oropharynx is clear and moist. No oropharyngeal exudate.  Eyes: Conjunctivae and EOM are normal. Pupils are equal, round, and reactive to light. Right eye exhibits no discharge. Left eye exhibits no discharge.  Neck: Normal range of motion. No tracheal deviation present. No thyromegaly present.  Cardiovascular: Normal rate, regular rhythm, normal heart sounds and intact distal pulses.  Exam reveals no friction rub.   No murmur heard. Respiratory: Effort normal and breath sounds normal. No respiratory distress. She has no wheezes.  GI: Soft. Bowel sounds are normal. She exhibits no distension. There is no  tenderness. There is no rebound.  Musculoskeletal: Normal range of motion. She exhibits no edema and no tenderness.  Neurological: She is alert and oriented to person, place, and time. She displays normal reflexes. No cranial nerve deficit. She exhibits normal muscle tone. Coordination normal.  Right quadriceps 4/5. Right L4 decreased sensation to pinprick and light touch right patellar reflex absent Achilles reflex absent bilaterally.  Skin: Skin is warm and dry. No rash noted. She is not diaphoretic. No erythema. No pallor.  Psychiatric: She has a normal mood and affect. Her behavior is normal. Judgment and thought content normal.     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 Plan right L4-5 laminotomy and resection of synovial cyst. Risks and benefits been explained. Patient wishes to proceed.  Kathaleen Maser Dayjah Selman 07/20/2013, 7:35 AM

## 2013-07-21 LAB — GLUCOSE, CAPILLARY: GLUCOSE-CAPILLARY: 110 mg/dL — AB (ref 70–99)

## 2013-07-21 MED ORDER — HYDROCODONE-ACETAMINOPHEN 5-325 MG PO TABS: 1.0000 | ORAL_TABLET | ORAL | Status: DC | PRN

## 2013-07-21 MED ORDER — CYCLOBENZAPRINE HCL 10 MG PO TABS: 10.0000 mg | ORAL_TABLET | Freq: Three times a day (TID) | ORAL | Status: DC | PRN

## 2013-07-21 NOTE — Discharge Summary (Signed)
Physician Discharge Summary  Patient ID: Colleen BarryBibi Z Delashmit MRN: [id number] DOB/AGE: 1945/10/26 68 y.o.  Admit date: 07/20/2013 Discharge date: 07/21/2013  Admission Diagnoses:  Discharge Diagnoses:  Principal Problem:   Synovial cyst of lumbar facet joint Active Problems:   Synovial cyst of lumbar spine   Discharged Condition: good  Hospital Course: The patient was admitted the hospital where she underwent uncomplicated laminotomy and resection of synovial cyst. Postoperatively she is doing well. Pain is relieved. She is ambulating well. Ready for discharge home.  Consults:   Significant Diagnostic Studies:   Treatments:   Discharge Exam: Blood pressure 94/67, pulse 63, temperature 98 F (36.7 C), temperature source Oral, resp. rate 18, weight 118.389 kg (261 lb), SpO2 97.00%. Awake and alert. Oriented appropriate. Motor and sensory function intact. Wound clean and dry. Chest and abdomen benign.   Disposition: 01-Home or Self Care   Future Appointments Provider Department Dept Phone   08/08/2013 10:00 AM [id number] [phone]   Please wear two piece clothing and wear no powder or deodorant. Please arrive 15 minutes early prior to your appointment time.       Medication List         aspirin 81 MG tablet  Take 162 mg by mouth daily.     CALCIUM + D PO  Take 1 tablet by mouth 3 (three) times a week.     clotrimazole-betamethasone cream  Commonly known as:  LOTRISONE  Apply 1 application topically daily as needed (for rash).     cyclobenzaprine 10 MG tablet  Commonly known as:  FLEXERIL  Take 1 tablet (10 mg total) by mouth 3 (three) times daily as needed for muscle spasms.     fish oil-omega-3 fatty acids 1000 MG capsule  Take 1 g by mouth daily.     HYDROcodone-acetaminophen 5-325 MG per tablet  Commonly known as:  NORCO/VICODIN  Take 1-2 tablets by mouth every 4 (four) hours as needed for moderate pain.     lisinopril 20 MG tablet  Commonly known as:  PRINIVIL,ZESTRIL  Take 20 mg by mouth daily.     metFORMIN 500 MG tablet  Commonly known as:  GLUCOPHAGE  Take 500 mg by mouth daily with breakfast.     MULTIVITAMIN PO  Take by mouth daily.     naproxen sodium 220 MG tablet  Commonly known as:  ANAPROX  Take 440 mg by mouth 2 (two) times daily with a meal.     verapamil 240 MG 24 hr capsule  Commonly known as:  VERELAN PM  Take 240 mg by mouth daily with breakfast.         Signed: Kathaleen MaserHenry A Tezra Mahr 07/21/2013, 10:30 AM

## 2013-07-21 NOTE — Discharge Instructions (Signed)

## 2013-07-21 NOTE — Progress Notes (Signed)
Pt. discharged home accompanied by family. Prescriptions and discharge instructions given with verbalization of understanding. Incision site on back with no s/s of infection - no swelling, redness, bleeding, and/or drainage noted.  Lumbar surgery notes instructions given to patient and family member for home safety and precautions. Pt. and family stated understanding of instructions given.

## 2013-07-23 ENCOUNTER — Encounter (HOSPITAL_COMMUNITY): Payer: Self-pay | Admitting: Neurosurgery

## 2013-08-08 ENCOUNTER — Ambulatory Visit (HOSPITAL_COMMUNITY)
Admission: RE | Admit: 2013-08-08 | Discharge: 2013-08-08 | Disposition: A | Discharge status: Home / Self Care | Payer: Medicare Other | Source: Ambulatory Visit | Attending: Family Medicine | Admitting: Family Medicine

## 2013-08-08 DIAGNOSIS — Z1231 Encounter for screening mammogram for malignant neoplasm of breast: Secondary | ICD-10-CM | POA: Insufficient documentation

## 2013-12-10 DIAGNOSIS — M546 Pain in thoracic spine: Secondary | ICD-10-CM | POA: Insufficient documentation

## 2013-12-10 DIAGNOSIS — E1169 Type 2 diabetes mellitus with other specified complication: Secondary | ICD-10-CM | POA: Insufficient documentation

## 2013-12-10 DIAGNOSIS — E1159 Type 2 diabetes mellitus with other circulatory complications: Secondary | ICD-10-CM | POA: Insufficient documentation

## 2013-12-10 DIAGNOSIS — E119 Type 2 diabetes mellitus without complications: Secondary | ICD-10-CM | POA: Insufficient documentation

## 2014-07-02 ENCOUNTER — Other Ambulatory Visit: Payer: Self-pay | Admitting: Dermatology

## 2014-07-18 ENCOUNTER — Other Ambulatory Visit (HOSPITAL_COMMUNITY): Payer: Self-pay | Admitting: Family Medicine

## 2014-07-18 DIAGNOSIS — Z1231 Encounter for screening mammogram for malignant neoplasm of breast: Secondary | ICD-10-CM

## 2014-08-12 ENCOUNTER — Other Ambulatory Visit (HOSPITAL_COMMUNITY): Payer: Self-pay | Admitting: Family Medicine

## 2014-08-12 ENCOUNTER — Ambulatory Visit (HOSPITAL_COMMUNITY)
Admission: RE | Admit: 2014-08-12 | Discharge: 2014-08-12 | Disposition: A | Discharge status: Home / Self Care | Payer: Medicare Other | Source: Ambulatory Visit | Attending: Family Medicine | Admitting: Family Medicine

## 2014-08-12 DIAGNOSIS — Z1231 Encounter for screening mammogram for malignant neoplasm of breast: Secondary | ICD-10-CM | POA: Diagnosis present

## 2015-02-04 DIAGNOSIS — Z1211 Encounter for screening for malignant neoplasm of colon: Secondary | ICD-10-CM | POA: Insufficient documentation

## 2015-07-15 ENCOUNTER — Other Ambulatory Visit: Payer: Self-pay

## 2015-07-15 DIAGNOSIS — Z1231 Encounter for screening mammogram for malignant neoplasm of breast: Secondary | ICD-10-CM

## 2015-08-13 ENCOUNTER — Ambulatory Visit
Admission: RE | Admit: 2015-08-13 | Discharge: 2015-08-13 | Disposition: A | Discharge status: Home / Self Care | Payer: Medicare Other | Source: Ambulatory Visit

## 2015-08-13 DIAGNOSIS — Z1231 Encounter for screening mammogram for malignant neoplasm of breast: Secondary | ICD-10-CM

## 2016-07-08 ENCOUNTER — Other Ambulatory Visit: Payer: Self-pay | Admitting: Family Medicine

## 2016-07-08 DIAGNOSIS — Z1231 Encounter for screening mammogram for malignant neoplasm of breast: Secondary | ICD-10-CM

## 2016-08-13 ENCOUNTER — Ambulatory Visit
Admission: RE | Admit: 2016-08-13 | Discharge: 2016-08-13 | Disposition: A | Discharge status: Home / Self Care | Payer: Medicare Other | Source: Ambulatory Visit | Attending: Family Medicine | Admitting: Family Medicine

## 2016-08-13 DIAGNOSIS — Z1231 Encounter for screening mammogram for malignant neoplasm of breast: Secondary | ICD-10-CM

## 2017-07-06 ENCOUNTER — Other Ambulatory Visit: Payer: Self-pay | Admitting: Family Medicine

## 2017-07-06 DIAGNOSIS — Z1231 Encounter for screening mammogram for malignant neoplasm of breast: Secondary | ICD-10-CM

## 2017-08-15 ENCOUNTER — Ambulatory Visit
Admission: RE | Admit: 2017-08-15 | Discharge: 2017-08-15 | Disposition: A | Discharge status: Home / Self Care | Payer: Medicare Other | Source: Ambulatory Visit | Attending: Family Medicine | Admitting: Family Medicine

## 2017-08-15 DIAGNOSIS — Z1231 Encounter for screening mammogram for malignant neoplasm of breast: Secondary | ICD-10-CM

## 2017-09-01 ENCOUNTER — Emergency Department (HOSPITAL_COMMUNITY)
Admission: EM | Admit: 2017-09-01 | Discharge: 2017-09-01 | Disposition: A | Discharge status: Home / Self Care | Payer: Medicare Other | Attending: Emergency Medicine | Admitting: Emergency Medicine

## 2017-09-01 ENCOUNTER — Encounter (HOSPITAL_COMMUNITY): Payer: Self-pay

## 2017-09-01 ENCOUNTER — Emergency Department (HOSPITAL_COMMUNITY): Payer: Medicare Other

## 2017-09-01 DIAGNOSIS — I10 Essential (primary) hypertension: Secondary | ICD-10-CM | POA: Diagnosis not present

## 2017-09-01 DIAGNOSIS — R0602 Shortness of breath: Secondary | ICD-10-CM | POA: Diagnosis not present

## 2017-09-01 DIAGNOSIS — Z7982 Long term (current) use of aspirin: Secondary | ICD-10-CM | POA: Diagnosis not present

## 2017-09-01 DIAGNOSIS — E119 Type 2 diabetes mellitus without complications: Secondary | ICD-10-CM | POA: Insufficient documentation

## 2017-09-01 DIAGNOSIS — Z79899 Other long term (current) drug therapy: Secondary | ICD-10-CM | POA: Diagnosis not present

## 2017-09-01 DIAGNOSIS — R05 Cough: Secondary | ICD-10-CM | POA: Diagnosis present

## 2017-09-01 LAB — CBC
HCT: 39.7 % (ref 36.0–46.0)
Hemoglobin: 12.5 g/dL (ref 12.0–15.0)
MCH: 28.2 pg (ref 26.0–34.0)
MCHC: 31.5 g/dL (ref 30.0–36.0)
MCV: 89.6 fL (ref 78.0–100.0)
Platelets: 229 10*3/uL (ref 150–400)
RBC: 4.43 MIL/uL (ref 3.87–5.11)
RDW: 15.6 % — ABNORMAL HIGH (ref 11.5–15.5)
WBC: 10.5 10*3/uL (ref 4.0–10.5)

## 2017-09-01 LAB — COMPREHENSIVE METABOLIC PANEL
ALT: 21 U/L (ref 14–54)
AST: 27 U/L (ref 15–41)
Albumin: 3.5 g/dL (ref 3.5–5.0)
Alkaline Phosphatase: 62 U/L (ref 38–126)
Anion gap: 9 (ref 5–15)
BILIRUBIN TOTAL: 0.6 mg/dL (ref 0.3–1.2)
BUN: 16 mg/dL (ref 6–20)
CO2: 26 mmol/L (ref 22–32)
Calcium: 9.7 mg/dL (ref 8.9–10.3)
Chloride: 107 mmol/L (ref 101–111)
Creatinine, Ser: 0.94 mg/dL (ref 0.44–1.00)
GFR calc Af Amer: >60 mL/min (ref >60)
GFR calc non Af Amer: 59 mL/min — ABNORMAL LOW (ref >60)
Glucose, Bld: 122 mg/dL — ABNORMAL HIGH (ref 65–99)
Potassium: 3.9 mmol/L (ref 3.5–5.1)
Sodium: 142 mmol/L (ref 135–145)
Total Protein: 7.6 g/dL (ref 6.5–8.1)

## 2017-09-01 LAB — BRAIN NATRIURETIC PEPTIDE: B Natriuretic Peptide: 38.3 pg/mL (ref 0.0–100.0)

## 2017-09-01 LAB — I-STAT TROPONIN, ED: Troponin i, poc: 0 ng/mL (ref 0.00–0.08)

## 2017-09-01 MED ORDER — ALBUTEROL SULFATE HFA 108 (90 BASE) MCG/ACT IN AERS
2.0000 | INHALATION_SPRAY | Freq: Four times a day (QID) | RESPIRATORY_TRACT | Status: DC
  Administered 2017-09-01: 2 via RESPIRATORY_TRACT
  Filled 2017-09-01: qty 6.7

## 2017-09-01 MED ORDER — PREDNISONE 20 MG PO TABS
60.0000 mg | ORAL_TABLET | ORAL | Status: AC
  Administered 2017-09-01: 60 mg via ORAL
  Filled 2017-09-01: qty 3

## 2017-09-01 MED ORDER — PREDNISONE 20 MG PO TABS: 40.0000 mg | ORAL_TABLET | Freq: Every day | ORAL | 0 refills | Status: DC

## 2017-09-01 NOTE — ED Notes (Signed)
ekg given to Dr Clovis Fredrickson

## 2017-09-01 NOTE — Discharge Instructions (Signed)
As discussed, you likely have bronchitis, or inflammation of the lungs. Please use the provided albuterol every 4 hours for the next 2 days, then as needed. In addition, take the prescribed steroids every day for the next 4 days. Patient to follow-up with your physician, or return here for concerning changes in your condition.

## 2017-09-01 NOTE — ED Provider Notes (Signed)
[facility] EMERGENCY DEPARTMENT Provider Note   CSN: [id number] Arrival date & time: 09/01/17  1639     History   Chief Complaint No chief complaint on file.   HPI [name] is a 72 y.o. female.  HPI Patient presents with ongoing cough, dyspnea. Onset was about 1 week ago, and since onset symptoms have been persistent. No chest pain, no abdominal pain, no headache, no fever, chills. Patient has history of diabetes, hypercholesterolemia, morbid obesity, denies history of cardiac disease, congestive heart failure. As his symptoms have been persistent for 1 week, she went to her physician's office today. She was seen, evaluated, had x-ray, albuterol, though she has no known intrinsic pulmonary disease, and with minimal improvement in her wheezing, concern for her dyspnea she was sent here for evaluation. She is accompanied by a friend who assists with the HPI. Past Medical History:  Diagnosis Date  . 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 Candida infection    fingernails  . Cholelithiasis   . Diabetes mellitus without complication (HCC) 06/2013   Type 2 NIDDM x 1 month  . Diverticulosis   . Gout   . H/O hepatitis 2011   viral  . Hearing loss   . Hepatitis   . Hypertension   . Menopause   . Obesity   . Prediabetes   . Shortness of breath    with exertion    Patient Active Problem List   Diagnosis Date Noted  . Synovial cyst of lumbar facet joint 07/20/2013  . Synovial cyst of lumbar spine 07/20/2013  . Cholelithiasis 12/08/2010    Past Surgical History:  Procedure Laterality Date  . ABDOMINAL HYSTERECTOMY    . JOINT REPLACEMENT    . LUMBAR LAMINECTOMY/DECOMPRESSION MICRODISCECTOMY Right 07/20/2013   Procedure: LUMBAR LAMINECTOMY/DECOMPRESSION MICRODISCECTOMY RIGHT LUMBAR FOUR-FIVE;  Surgeon: Temple Pacini, MD;  Location: [facility];  Service: Neurosurgery;  Laterality: Right;  . REPLACEMENT TOTAL KNEE BILATERAL     2007/2009     OB  History   None      Home Medications    Prior to Admission medications   Medication Sig Start Date End Date Taking? Authorizing Provider  aspirin 81 MG tablet Take 81 mg by mouth daily.    Yes [provider]  fish oil-omega-3 fatty acids 1000 MG capsule Take 1 g by mouth daily.     Yes [provider]  lisinopril (PRINIVIL,ZESTRIL) 40 MG tablet Take 40 mg by mouth daily. 07/11/17  Yes [provider]  Multiple Vitamin (MULTIVITAMIN PO) Take by mouth daily.     Yes [provider]  pravastatin (PRAVACHOL) 20 MG tablet Take 20 mg by mouth every evening.   Yes [provider]  verapamil (VERELAN PM) 240 MG 24 hr capsule Take 240 mg by mouth daily with breakfast.    Yes [provider]  cyclobenzaprine (FLEXERIL) 10 MG tablet Take 1 tablet (10 mg total) by mouth 3 (three) times daily as needed for muscle spasms. Patient not taking: Reported on 09/01/2017 07/21/13   Julio Sicks, MD  HYDROcodone-acetaminophen (NORCO/VICODIN) 5-325 MG per tablet Take 1-2 tablets by mouth every 4 (four) hours as needed for moderate pain. Patient not taking: Reported on 09/01/2017 07/21/13   Julio Sicks, MD    Family History Family History  Problem Relation Age of Onset  . Heart disease Mother 29       heart attack  . Stroke Father 31  Social History Social History   Tobacco Use  . Smoking status: Never Smoker  . Smokeless tobacco: Never Used  Substance Use Topics  . Alcohol use: Yes    Comment: occasional  . Drug use: No     Allergies   Gabapentin   Review of Systems Review of Systems  Constitutional:       Per HPI, otherwise negative  HENT:       Per HPI, otherwise negative  Respiratory:       Per HPI, otherwise negative  Cardiovascular:       Per HPI, otherwise negative  Gastrointestinal: Negative for vomiting.  Endocrine:       Negative aside from HPI  Genitourinary:       Neg aside from HPI   Musculoskeletal:       Per HPI,  otherwise negative  Skin: Negative.   Neurological: Negative for syncope.     Physical Exam Updated Vital Signs BP 105/67   Pulse 83   Temp 98.3 F (36.8 C) (Oral)   Resp 19   SpO2 98%   Physical Exam  Constitutional: She is oriented to person, place, and time. She appears well-developed and well-nourished. No distress.  Morbidly obese elderly female awake, alert, smiling, interacting with her colleague  HENT:  Head: Normocephalic and atraumatic.  Eyes: Conjunctivae and EOM are normal.  Cardiovascular: Normal rate and regular rhythm.  Pulmonary/Chest: No stridor. She has decreased breath sounds. She has wheezes.  Abdominal: She exhibits no distension.  Musculoskeletal:  Patient with very large habitus, BMI greater than 50, unable to ascertain edema, but no pitting lesions  Neurological: She is alert and oriented to person, place, and time. No cranial nerve deficit.  Skin: Skin is warm and dry.  Psychiatric: She has a normal mood and affect.  Nursing note and vitals reviewed.    ED Treatments / Results  Labs (all labs ordered are listed, but only abnormal results are displayed) Labs Reviewed  CBC - Abnormal; Notable for the following components:      Result Value   RDW 15.6 (*)    All other components within normal limits  COMPREHENSIVE METABOLIC PANEL - Abnormal; Notable for the following components:   Glucose, Bld 122 (*)    GFR calc non Af Amer 59 (*)    All other components within normal limits  BRAIN NATRIURETIC PEPTIDE  I-STAT TROPONIN, ED    EKG EKG Interpretation  Date/Time:  Thursday Sep 01 2017 17:03:05 EDT Ventricular Rate:  87 PR Interval:  168 QRS Duration: 98 QT Interval:  388 QTC Calculation: 466 R Axis:   39 Text Interpretation:  Normal sinus rhythm with sinus arrhythmia Normal ECG Normal ECG Confirmed by Gerhard Munch [phone]) on 09/01/2017 5:05:17 PM   Radiology Dg Chest 2 View  Result Date: 09/01/2017 CLINICAL DATA:  Coughing  congestion.  Wheezing. EXAM: CHEST - 2 VIEW COMPARISON:  09/01/2017 FINDINGS: Asymmetric elevation right hemidiaphragm. Interstitial markings are diffusely coarsened with chronic features. The lungs are clear without focal pneumonia, edema, pneumothorax or pleural effusion. Cardiopericardial silhouette is at upper limits of normal for size. The visualized bony structures of the thorax are intact. Telemetry leads overlie the chest. IMPRESSION: No active cardiopulmonary disease. Electronically Signed   By: Kennith Center M.D.   On: 09/01/2017 18:12    Procedures Procedures (including critical care time)  Medications Ordered in ED   Initial Impression / Assessment and Plan / ED Course  I have reviewed the triage vital signs  and the nursing notes.  Pertinent labs & imaging results that were available during my care of the patient were reviewed by me and considered in my medical decision making (see chart for details).     7:05 PM Awake and alert, in no distress, smiling, remains with no hypoxia, increased work of breathing, tachypnea, tachycardia, fever. We discussed all findings including reassuring BNP value, absence of pleural effusions, pneumonia, no obvious abnormalities on chest x-ray besides mild cardiomegaly. Given these reassuring findings, though the patient does have wheezing, complains of some dyspnea, there is low suspicion for new CHF, no evidence for coronary ischemia, patient has minimal risk profile for PE. She did have a flight to [place], but there is a short duration. Patient be started on medication for her wheezing, suspicious for bronchitis Patient notes importance of following up in 72 hours if she has not improved, or earlier for concerning changes per Patient starting prednisone and albuterol.  Final Clinical Impressions(s) / ED Diagnoses  Dyspnea   Gerhard Munch, MD 09/01/17 1907

## 2017-09-01 NOTE — ED Notes (Signed)
Pt stable, ambulatory, states understanding of discharge instructions, family at bedside. 

## 2017-09-01 NOTE — ED Triage Notes (Signed)
Patient sent from [facility] urgent care fior further evaluation of cardiomegaly with pulmonary congestion and wheezing, patient complains of exertional SOB, no CP

## 2017-09-01 NOTE — ED Notes (Signed)
Patient transported to X-ray 

## 2018-10-16 ENCOUNTER — Other Ambulatory Visit: Payer: Self-pay | Admitting: Family Medicine

## 2018-10-16 DIAGNOSIS — Z1231 Encounter for screening mammogram for malignant neoplasm of breast: Secondary | ICD-10-CM

## 2018-11-30 ENCOUNTER — Ambulatory Visit
Admission: RE | Admit: 2018-11-30 | Discharge: 2018-11-30 | Disposition: A | Discharge status: Home / Self Care | Payer: Medicare Other | Source: Ambulatory Visit | Attending: Family Medicine | Admitting: Family Medicine

## 2018-11-30 DIAGNOSIS — Z1231 Encounter for screening mammogram for malignant neoplasm of breast: Secondary | ICD-10-CM

## 2019-03-25 NOTE — Progress Notes (Signed)
Cardiology Office Note:    Date:  03/26/2019   ID:  [name], DOB 09-20-1945, MRN [id number]  PCP:  [name]  Cardiologist:  No primary care provider on file.  Electrophysiologist:  None   Referring Holland: [name]   Chief Complaint  Patient presents with  . Shortness of Breath    History of Present Illness:    [name] is a 73 y.o. female with a hx of type 2 diabetes, hypertension, obesity who is referred by [name] for evaluation of shortness of breath.  Patient reports that she has been short of breath with exertion.  Denies any exertional chest pain.  For exercise, she [facility].  She walks for 15 to 20 minutes but has to take breaks.  States that walking up a flight of stairs causes her to get short of breath.  States that her sister, who is 74 years old, just had a stress test which was positive and ultimately ended up undergoing PCI, which prompted her to seek an evaluation.  She states her blood pressure is usually under good control, 120s to 130s over 70s.  No smoking history.  Does report that she has been told she snores.   Past Medical History:  Diagnosis Date  . 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 Candida infection    fingernails  . Cholelithiasis   . Diabetes mellitus without complication (HCC) 06/2013   Type 2 NIDDM x 1 month  . Diverticulosis   . Gout   . H/O hepatitis 2011   viral  . Hearing loss   . Hepatitis   . Hypertension   . Menopause   . Obesity   . Prediabetes   . Shortness of breath    with exertion    Past Surgical History:  Procedure Laterality Date  . ABDOMINAL HYSTERECTOMY    . JOINT REPLACEMENT    . LUMBAR LAMINECTOMY/DECOMPRESSION MICRODISCECTOMY Right 07/20/2013   Procedure: LUMBAR LAMINECTOMY/DECOMPRESSION MICRODISCECTOMY RIGHT LUMBAR FOUR-FIVE;  Surgeon: Colleen PaciniHenry A Pool, Holland;  Location: [facility];  Service: Neurosurgery;  Laterality:  Right;  . REPLACEMENT TOTAL KNEE BILATERAL     2007/2009    Current Medications: Current Meds  Medication Sig  . aspirin 81 MG tablet Take 81 mg by mouth daily.   . fish oil-omega-3 fatty acids 1000 MG capsule Take 1 g by mouth daily.    Marland Kitchen. lisinopril (PRINIVIL,ZESTRIL) 40 MG tablet Take 40 mg by mouth daily.  . Multiple Vitamin (MULTIVITAMIN PO) Take by mouth daily.    . verapamil (VERELAN PM) 240 MG 24 hr capsule Take 240 mg by mouth daily with breakfast.   . [DISCONTINUED] pravastatin (PRAVACHOL) 20 MG tablet Take 20 mg by mouth every evening.     Allergies:   Gabapentin   Social History   Socioeconomic History  . Marital status: Widowed    Spouse name: Not on file  . Number of children: Not on file  . Years of education: Not on file  . Highest education level: Not on file  Occupational History  . Not on file  Tobacco Use  . Smoking status: Never Smoker  . Smokeless tobacco: Never Used  Substance and Sexual Activity  . Alcohol use: Yes    Comment: occasional  . Drug use: No  . Sexual activity: Not on file  Other Topics Concern  . Not on file  Social History  Narrative  . Not on file   Social Determinants of Health   Financial Resource Strain:   . Difficulty of Paying Living Expenses: Not on file  Food Insecurity:   . Worried About Programme researcher, broadcasting/film/video in the Last Year: Not on file  . Ran Out of Food in the Last Year: Not on file  Transportation Needs:   . Lack of Transportation (Medical): Not on file  . Lack of Transportation (Non-Medical): Not on file  Physical Activity:   . Days of Exercise per Week: Not on file  . Minutes of Exercise per Session: Not on file  Stress:   . Feeling of Stress : Not on file  Social Connections:   . Frequency of Communication with Friends and Family: Not on file  . Frequency of Social Gatherings with Friends and Family: Not on file  . Attends Religious Services: Not on file  . Active Member of Clubs or Organizations: Not on  file  . Attends Banker Meetings: Not on file  . Marital Status: Not on file     Family History: The patient's family history includes Heart disease (age of onset: 79) in her mother; Stroke (age of onset: 41) in her father.  ROS:   Please see the history of present illness.     All other systems reviewed and are negative.  EKGs/Labs/Other Studies Reviewed:    The following studies were reviewed today:   EKG:  EKG is  ordered today.  The ekg ordered today demonstrates normal sinus rhythm, rate 89, Q waves in leads III, aVF, poor R wave progression  Recent Labs: No results found for requested labs within last 8760 hours.  Recent Lipid Panel No results found for: CHOL, TRIG, HDL, CHOLHDL, VLDL, LDLCALC, LDLDIRECT  Physical Exam:    VS:  BP (!) 142/88   Pulse 89   Temp 97.7 F (36.5 C)   Ht 5\' 1"  (1.549 m)   Wt 258 lb 12.8 oz (117.4 kg)   BMI 48.90 kg/m     Wt Readings from Last 3 Encounters:  03/26/19 258 lb 12.8 oz (117.4 kg)  07/20/13 261 lb (118.4 kg)  07/16/13 261 lb 14.4 oz (118.8 kg)     GEN:  Well nourished, well developed in no acute distress HEENT: Normal NECK: No JVD; No carotid bruits LYMPHATICS: No lymphadenopathy CARDIAC: RRR, 2/6 systolic murmur loudest at RUSB RESPIRATORY:  Clear to auscultation without rales, wheezing or rhonchi  ABDOMEN: Soft, non-tender, non-distended MUSCULOSKELETAL:  No edema; No deformity  SKIN: Warm and dry NEUROLOGIC:  Alert and oriented x 3 PSYCHIATRIC:  Normal affect   ASSESSMENT:    1. SOB (shortness of breath)   2. Essential hypertension   3. Hyperlipidemia, unspecified hyperlipidemia type   4. Snores    PLAN:    In order of problems listed above:  Dyspnea on exertion: Could be related to deconditioning, but may represent anginal equivalent.  Will check Lexiscan Myoview to evaluate for ischemia.  TTE to rule out structural heart disease.    Hypertension: On lisinopril 40 mg daily and verapamil 240  mg daily  Hyperlipidemia: LDL 86 on 06/21/2018.  On pravastatin 20 mg daily. 10-year ASCVD risk score 39%.  Given diabetes and high risk ASCVD score, recommend higher intensity statin.  Switch to atorvastatin 40 mg daily  Type 2 diabetes: A1c 6.8 on 12/22/2018, not currently on any medications  Snoring: Concern for OSA, will check sleep study  RTC in 3 months  Medication Adjustments/Labs and Tests Ordered: Current medicines are reviewed at length with the patient today.  Concerns regarding medicines are outlined above.  Orders Placed This Encounter  Procedures  . Myocardial Perfusion Imaging  . ECHOCARDIOGRAM COMPLETE  . Split night study   Meds ordered this encounter  Medications  . atorvastatin (LIPITOR) 40 MG tablet    Sig: Take 1 tablet (40 mg total) by mouth daily.    Dispense:  90 tablet    Refill:  3    Patient Instructions  Medication Instructions:  Stop Pravastatin Start Atorvastatin 40 mg at night    Lab Work: None ordered   Testing/Procedures: Schedule Lexiscan Myoview Schedule Echo Schedule Sleep Study  Follow-Up: At [facility], you and your health needs are our priority.  As part of our continuing mission to provide you with exceptional heart care, we have created designated Provider Care Teams.  These Care Teams include your primary Cardiologist (physician) and Advanced Practice Providers (APPs -  Physician Assistants and Nurse Practitioners) who all work together to provide you with the care you need, when you need it.  Your next appointment:  3 months 06/2019  The format for your next appointment:  Office   Provider:  Dr.Nalia Honeycutt      Signed, Donato Heinz, Holland  03/26/2019 12:38 PM    [place]

## 2019-03-26 ENCOUNTER — Ambulatory Visit: Payer: Medicare Other | Admitting: Cardiology

## 2019-03-26 ENCOUNTER — Encounter: Payer: Self-pay | Admitting: Cardiology

## 2019-03-26 ENCOUNTER — Other Ambulatory Visit: Payer: Self-pay

## 2019-03-26 VITALS — BP 142/88 | HR 89 | Temp 97.7°F | Ht 61.0 in | Wt 258.8 lb

## 2019-03-26 DIAGNOSIS — R0683 Snoring: Secondary | ICD-10-CM

## 2019-03-26 DIAGNOSIS — E785 Hyperlipidemia, unspecified: Secondary | ICD-10-CM

## 2019-03-26 DIAGNOSIS — I10 Essential (primary) hypertension: Secondary | ICD-10-CM

## 2019-03-26 DIAGNOSIS — R0602 Shortness of breath: Secondary | ICD-10-CM

## 2019-03-26 MED ORDER — ATORVASTATIN CALCIUM 40 MG PO TABS: 40.0000 mg | ORAL_TABLET | Freq: Every day | ORAL | 3 refills | Status: DC

## 2019-03-26 NOTE — Patient Instructions (Signed)
Medication Instructions:  Stop Pravastatin Start Atorvastatin 40 mg at night    Lab Work: None ordered   Testing/Procedures: Schedule Lexiscan Myoview Schedule Echo Schedule Sleep Study  Follow-Up: At [facility], you and your health needs are our priority.  As part of our continuing mission to provide you with exceptional heart care, we have created designated Provider Care Teams.  These Care Teams include your primary Cardiologist (physician) and Advanced Practice Providers (APPs -  Physician Assistants and Nurse Practitioners) who all work together to provide you with the care you need, when you need it.  Your next appointment:  3 months 06/2019  The format for your next appointment:  Office   Provider:  Dr.Schumann

## 2019-03-26 NOTE — Addendum Note (Signed)
Addended by: Talar Fraley N on: 03/26/2019 03:02 PM   Modules accepted: Orders  

## 2019-03-27 ENCOUNTER — Telehealth: Payer: Self-pay | Admitting: *Deleted

## 2019-03-27 NOTE — Telephone Encounter (Signed)
Left message to return a call to discuss sleep study appointment details. 

## 2019-03-28 NOTE — Telephone Encounter (Signed)
Patient notified of sleep study appointment. She wants to wait until after the holidays. Sleep labs contact information provided to her for rescheduling.

## 2019-04-02 ENCOUNTER — Other Ambulatory Visit (HOSPITAL_COMMUNITY): Payer: Medicare Other

## 2019-04-04 ENCOUNTER — Encounter (HOSPITAL_BASED_OUTPATIENT_CLINIC_OR_DEPARTMENT_OTHER): Payer: Medicare Other | Admitting: Cardiovascular Disease

## 2019-04-11 ENCOUNTER — Telehealth (HOSPITAL_COMMUNITY): Payer: Self-pay

## 2019-04-11 NOTE — Telephone Encounter (Signed)
Encounter complete. 

## 2019-04-12 ENCOUNTER — Telehealth (HOSPITAL_COMMUNITY): Payer: Self-pay

## 2019-04-12 NOTE — Telephone Encounter (Signed)
Encounter complete. 

## 2019-04-17 ENCOUNTER — Other Ambulatory Visit: Payer: Self-pay

## 2019-04-17 ENCOUNTER — Other Ambulatory Visit (HOSPITAL_COMMUNITY): Payer: Medicare Other

## 2019-04-17 ENCOUNTER — Ambulatory Visit (HOSPITAL_COMMUNITY)
Admission: RE | Admit: 2019-04-17 | Discharge: 2019-04-17 | Disposition: A | Discharge status: Home / Self Care | Payer: Medicare Other | Source: Ambulatory Visit | Attending: Cardiology | Admitting: Cardiology

## 2019-04-17 DIAGNOSIS — R0602 Shortness of breath: Secondary | ICD-10-CM | POA: Insufficient documentation

## 2019-04-17 MED ORDER — AMINOPHYLLINE 25 MG/ML IV SOLN
75.0000 mg | Freq: Once | INTRAVENOUS | Status: AC
  Administered 2019-04-17: 75 mg via INTRAVENOUS

## 2019-04-17 MED ORDER — TECHNETIUM TC 99M TETROFOSMIN IV KIT
28.0000 | PACK | Freq: Once | INTRAVENOUS | Status: AC | PRN
  Administered 2019-04-17: 28 via INTRAVENOUS
  Filled 2019-04-17: qty 28

## 2019-04-17 MED ORDER — REGADENOSON 0.4 MG/5ML IV SOLN
0.4000 mg | Freq: Once | INTRAVENOUS | Status: AC
  Administered 2019-04-17: 0.4 mg via INTRAVENOUS

## 2019-04-18 ENCOUNTER — Ambulatory Visit (HOSPITAL_COMMUNITY)
Admission: RE | Admit: 2019-04-18 | Discharge: 2019-04-18 | Disposition: A | Discharge status: Home / Self Care | Payer: Medicare Other | Source: Ambulatory Visit | Attending: Cardiology | Admitting: Cardiology

## 2019-04-18 LAB — MYOCARDIAL PERFUSION IMAGING
LV dias vol: 73 mL (ref 46–106)
LV sys vol: 20 mL
Peak HR: 88 {beats}/min
Rest HR: 71 {beats}/min
SDS: 0
SRS: 0
SSS: 0
TID: 0.96

## 2019-04-18 MED ORDER — TECHNETIUM TC 99M TETROFOSMIN IV KIT
29.2000 | PACK | Freq: Once | INTRAVENOUS | Status: AC | PRN
  Administered 2019-04-18: 29.2 via INTRAVENOUS

## 2019-04-19 ENCOUNTER — Other Ambulatory Visit (HOSPITAL_COMMUNITY): Payer: Medicare Other

## 2019-04-20 ENCOUNTER — Encounter (HOSPITAL_BASED_OUTPATIENT_CLINIC_OR_DEPARTMENT_OTHER): Payer: Medicare Other | Admitting: Cardiovascular Disease

## 2019-05-03 ENCOUNTER — Other Ambulatory Visit: Payer: Self-pay

## 2019-05-03 ENCOUNTER — Ambulatory Visit (HOSPITAL_COMMUNITY): Payer: Medicare Other | Attending: Internal Medicine

## 2019-05-03 DIAGNOSIS — R0602 Shortness of breath: Secondary | ICD-10-CM | POA: Diagnosis not present

## 2019-06-23 NOTE — Progress Notes (Signed)
Cardiology Office Note:    Date:  06/25/2019   ID:  [name], DOB 05-Jul-1945, MRN [id number]  PCP:  Lawerance Cruel, MD  Cardiologist:  No primary care provider on file.  Electrophysiologist:  None   Referring MD: Lawerance Cruel, MD   Chief Complaint  Patient presents with  . Shortness of Breath    History of Present Illness:    [name] is a 74 y.o. female with a hx of type 2 diabetes, hypertension, obesity who presents for follow-up.  She was referred by Dr. Harrington Challenger for evaluation of shortness of breath, initially seen on 03/26/2019.  Patient reports that she has been short of breath with exertion.  Denies any exertional chest pain.  For exercise, she reports that she goes to [facility] about twice per week.  She walks for 15 to 20 minutes but has to take breaks.  States that walking up a flight of stairs causes her to get short of breath.  States that her sister, who is 16 years old, had a stress test which was positive and ultimately ended up undergoing PCI, which prompted her to seek an evaluation.  She states her blood pressure is usually under good control, 120s to 130s over 70s.  No smoking history.  Does report that she has been told she snores.  Lexiscan Myoview was done on 04/18/2019, which showed no evidence of ischemia or prior myocardial infarction.  TTE was done on 05/03/2019, which showed EF 65 to 69%, grade 1 diastolic dysfunction, normal RV function, no significant valvular disease.  Since last clinic visit, she reports that she has been doing well.  Continues to have some dyspnea with exertion, denies any chest pain.  States that she will walk at [facility] for 15 to 20 minutes, but takes breaks frequently.   Past Medical History:  Diagnosis Date  . 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 Candida infection    fingernails  . Cholelithiasis   . Diabetes mellitus without complication ([place]) 09/7891   Type 2 NIDDM x 1 month  .  Diverticulosis   . Gout   . H/O hepatitis 2011   viral  . Hearing loss   . Hepatitis   . Hypertension   . Menopause   . Obesity   . Prediabetes   . Shortness of breath    with exertion    Past Surgical History:  Procedure Laterality Date  . ABDOMINAL HYSTERECTOMY    . JOINT REPLACEMENT    . LUMBAR LAMINECTOMY/DECOMPRESSION MICRODISCECTOMY Right 07/20/2013   Procedure: LUMBAR LAMINECTOMY/DECOMPRESSION MICRODISCECTOMY RIGHT LUMBAR FOUR-FIVE;  Surgeon: Charlie Pitter, MD;  Location: [place] [facility];  Service: Neurosurgery;  Laterality: Right;  . REPLACEMENT TOTAL KNEE BILATERAL     2007/2009    Current Medications: Current Meds  Medication Sig  . aspirin 81 MG tablet Take 81 mg by mouth daily.   . fish oil-omega-3 fatty acids 1000 MG capsule Take 1 g by mouth daily.    Marland Kitchen lisinopril (PRINIVIL,ZESTRIL) 40 MG tablet Take 40 mg by mouth daily.  . Multiple Vitamin (MULTIVITAMIN PO) Take by mouth daily.    . pravastatin (PRAVACHOL) 20 MG tablet Take 20 mg by mouth daily.  . verapamil (VERELAN PM) 240 MG 24 hr capsule Take 240 mg by mouth daily with breakfast.   . [DISCONTINUED] atorvastatin (LIPITOR) 40 MG tablet Take 1 tablet (40 mg total) by mouth daily.     Allergies:   Gabapentin  Social History   Socioeconomic History  . Marital status: Widowed    Spouse name: Not on file  . Number of children: Not on file  . Years of education: Not on file  . Highest education level: Not on file  Occupational History  . Not on file  Tobacco Use  . Smoking status: Never Smoker  . Smokeless tobacco: Never Used  Substance and Sexual Activity  . Alcohol use: Yes    Comment: occasional  . Drug use: No  . Sexual activity: Not on file  Other Topics Concern  . Not on file  Social History Narrative  . Not on file   Social Determinants of Health   Financial Resource Strain:   . Difficulty of Paying Living Expenses:   Food Insecurity:   . Worried About Programme researcher, broadcasting/film/video in the Last  Year:   . Barista in the Last Year:   Transportation Needs:   . Freight forwarder (Medical):   Marland Kitchen Lack of Transportation (Non-Medical):   Physical Activity:   . Days of Exercise per Week:   . Minutes of Exercise per Session:   Stress:   . Feeling of Stress :   Social Connections:   . Frequency of Communication with Friends and Family:   . Frequency of Social Gatherings with Friends and Family:   . Attends Religious Services:   . Active Member of Clubs or Organizations:   . Attends Banker Meetings:   Marland Kitchen Marital Status:      Family History: The patient's family history includes Heart disease (age of onset: 78) in her mother; Stroke (age of onset: 36) in her father.  ROS:   Please see the history of present illness.     All other systems reviewed and are negative.  EKGs/Labs/Other Studies Reviewed:    The following studies were reviewed today:   EKG:  EKG is  ordered today.  The ekg ordered today demonstrates normal sinus rhythm, rate 89, Q waves in leads III, aVF, poor R wave progression  TTE 05/03/2019: 1. Left ventricular ejection fraction, by visual estimation, is 65 to  70%. The left ventricle has normal function. There is no left ventricular  hypertrophy.  2. Left ventricular diastolic parameters are consistent with Grade I  diastolic dysfunction (impaired relaxation).  3. The left ventricle has no regional wall motion abnormalities.  4. Global right ventricle has normal systolic function.The right  ventricular size is normal. No increase in right ventricular wall  thickness.  5. Left atrial size was normal.  6. Right atrial size was normal.  7. Trivial pericardial effusion is present.  8. The mitral valve is normal in structure. No evidence of mitral valve  regurgitation.  9. The tricuspid valve is normal in structure.  10. The tricuspid valve is normal in structure. Tricuspid valve  regurgitation is trivial.  11. The aortic  valve is tricuspid. Aortic valve regurgitation is trivial.  Mild aortic valve sclerosis without stenosis.  12. The pulmonic valve was not well visualized. Pulmonic valve  regurgitation is not visualized.  13. Normal pulmonary artery systolic pressure.     Lexiscan Myoview 04/18/2019:  Nuclear stress EF: 72%. The left ventricular ejection fraction is hyperdynamic (>65%).  There was no ST segment deviation noted during stress.  This is a low risk study. There is no evidence of ischemia or previous myocardial infarction.  The study is normal.    Recent Labs: No results found for requested labs  within last 8760 hours.  Recent Lipid Panel No results found for: CHOL, TRIG, HDL, CHOLHDL, VLDL, LDLCALC, LDLDIRECT  Physical Exam:    VS:  BP 130/66   Pulse 89   Resp 15   Ht 5\' 1"  (1.549 m)   Wt 266 lb 12.8 oz (121 kg)   SpO2 95%   BMI 50.41 kg/m     Wt Readings from Last 3 Encounters:  06/25/19 266 lb 12.8 oz (121 kg)  04/17/19 258 lb (117 kg)  03/26/19 258 lb 12.8 oz (117.4 kg)     GEN:  Well nourished, well developed in no acute distress HEENT: Normal NECK: No JVD; No carotid bruits LYMPHATICS: No lymphadenopathy CARDIAC: RRR, 2/6 systolic murmur loudest at RUSB RESPIRATORY:  Clear to auscultation without rales, wheezing or rhonchi  ABDOMEN: Soft, non-tender, non-distended MUSCULOSKELETAL:  No edema; No deformity  SKIN: Warm and dry NEUROLOGIC:  Alert and oriented x 3 PSYCHIATRIC:  Normal affect   ASSESSMENT:    1. DOE (dyspnea on exertion)   2. Essential hypertension   3. Hyperlipidemia, unspecified hyperlipidemia type   4. Snores    PLAN:     Dyspnea on exertion: Suspect related to deconditioning.  No evidence of ischemia on Lexiscan Myoview.  No structural heart disease on TTE.  Hypertension: On lisinopril 40 mg daily and verapamil 240 mg daily.  Appears controlled  Hyperlipidemia: LDL 86 on 06/21/2018.  10-year ASCVD risk score 39%.  Given diabetes and  high risk ASCVD score, recommend higher intensity statin.  Started atorvastatin 40 mg daily in 03/2019, but patient states that she switched back to pravastatin and wishes to remain on that for now.  Type 2 diabetes: A1c 6.8 on 12/22/2018, not currently on any medications  Snoring: Concern for OSA, sleep study ordered  RTC in 1 year   Medication Adjustments/Labs and Tests Ordered: Current medicines are reviewed at length with the patient today.  Concerns regarding medicines are outlined above.  No orders of the defined types were placed in this encounter.  No orders of the defined types were placed in this encounter.   Patient Instructions  Medication Instructions:  Your physician recommends that you continue on your current medications as directed. Please refer to the Current Medication list given to you today.  *If you need a refill on your cardiac medications before your next appointment, please call your pharmacy*   Lab Work: NONE  Testing/Procedures: NONE  Follow-Up: At 02/21/2019, you and your health needs are our priority.  As part of our continuing mission to provide you with exceptional heart care, we have created designated Provider Care Teams.  These Care Teams include your primary Cardiologist (physician) and Advanced Practice Providers (APPs -  Physician Assistants and Nurse Practitioners) who all work together to provide you with the care you need, when you need it.  We recommend signing up for the patient portal called "MyChart".  Sign up information is provided on this After Visit Summary.  MyChart is used to connect with patients for Virtual Visits (Telemedicine).  Patients are able to view lab/test results, encounter notes, upcoming appointments, etc.  Non-urgent messages can be sent to your provider as well.   To learn more about what you can do with MyChart, go to [employer].    Your next appointment:   12 month(s)  The format for your next  appointment:   In Person  Provider:   [email or web address], MD         Signed, Epifanio Lesches  Karlyne Greenspan, MD  06/25/2019 12:52 PM    [facility]

## 2019-06-25 ENCOUNTER — Telehealth: Payer: Self-pay | Admitting: *Deleted

## 2019-06-25 ENCOUNTER — Other Ambulatory Visit: Payer: Self-pay

## 2019-06-25 ENCOUNTER — Encounter: Payer: Self-pay | Admitting: Cardiology

## 2019-06-25 ENCOUNTER — Ambulatory Visit: Payer: Medicare Other | Admitting: Cardiology

## 2019-06-25 VITALS — BP 130/66 | HR 89 | Resp 15 | Ht 61.0 in | Wt 266.8 lb

## 2019-06-25 DIAGNOSIS — E785 Hyperlipidemia, unspecified: Secondary | ICD-10-CM | POA: Diagnosis not present

## 2019-06-25 DIAGNOSIS — R0683 Snoring: Secondary | ICD-10-CM

## 2019-06-25 DIAGNOSIS — I10 Essential (primary) hypertension: Secondary | ICD-10-CM | POA: Diagnosis not present

## 2019-06-25 DIAGNOSIS — R0609 Other forms of dyspnea: Secondary | ICD-10-CM

## 2019-06-25 DIAGNOSIS — R0600 Dyspnea, unspecified: Secondary | ICD-10-CM | POA: Diagnosis not present

## 2019-06-25 NOTE — Patient Instructions (Signed)
Medication Instructions:  Your physician recommends that you continue on your current medications as directed. Please refer to the Current Medication list given to you today.  *If you need a refill on your cardiac medications before your next appointment, please call your pharmacy*   Lab Work: NONE  Testing/Procedures: NONE  Follow-Up: At [facility], you and your health needs are our priority.  As part of our continuing mission to provide you with exceptional heart care, we have created designated Provider Care Teams.  These Care Teams include your primary Cardiologist (physician) and Advanced Practice Providers (APPs -  Physician Assistants and Nurse Practitioners) who all work together to provide you with the care you need, when you need it.  We recommend signing up for the patient portal called "MyChart".  Sign up information is provided on this After Visit Summary.  MyChart is used to connect with patients for Virtual Visits (Telemedicine).  Patients are able to view lab/test results, encounter notes, upcoming appointments, etc.  Non-urgent messages can be sent to your provider as well.   To learn more about what you can do with MyChart, go to [email or web address].    Your next appointment:   12 month(s)  The format for your next appointment:   In Person  Provider:   Christopher Schumann, MD      

## 2019-06-25 NOTE — Telephone Encounter (Signed)
-----   Message from Harvel Ricks, RN sent at 06/25/2019 11:09 AM EDT ----- Sleep study was ordered in December, unsure if another precert is needed.    Patient ready to reschedule now that she has received vaccine.    Thanks!

## 2019-06-25 NOTE — Telephone Encounter (Signed)
Called patient to give her sleep study and COVID test appointments. She declined stating that she doesn't want to have it done right now. She may have it done after her second covid shot. Ordering provider will be notified.

## 2019-07-17 ENCOUNTER — Encounter (HOSPITAL_BASED_OUTPATIENT_CLINIC_OR_DEPARTMENT_OTHER): Payer: Medicare Other | Admitting: Cardiovascular Disease

## 2019-10-26 ENCOUNTER — Other Ambulatory Visit: Payer: Self-pay | Admitting: Family Medicine

## 2019-10-26 DIAGNOSIS — Z1231 Encounter for screening mammogram for malignant neoplasm of breast: Secondary | ICD-10-CM

## 2019-12-03 ENCOUNTER — Other Ambulatory Visit: Payer: Self-pay

## 2019-12-03 ENCOUNTER — Ambulatory Visit
Admission: RE | Admit: 2019-12-03 | Discharge: 2019-12-03 | Disposition: A | Discharge status: Home / Self Care | Payer: Medicare Other | Source: Ambulatory Visit | Attending: Family Medicine | Admitting: Family Medicine

## 2019-12-03 DIAGNOSIS — Z1231 Encounter for screening mammogram for malignant neoplasm of breast: Secondary | ICD-10-CM

## 2020-06-24 ENCOUNTER — Ambulatory Visit: Payer: Medicare Other | Admitting: Cardiology

## 2020-06-24 ENCOUNTER — Other Ambulatory Visit: Payer: Self-pay

## 2020-06-24 ENCOUNTER — Encounter: Payer: Self-pay | Admitting: Cardiology

## 2020-06-24 VITALS — BP 242/80 | HR 71 | Ht 61.0 in | Wt 249.2 lb

## 2020-06-24 DIAGNOSIS — I10 Essential (primary) hypertension: Secondary | ICD-10-CM | POA: Diagnosis not present

## 2020-06-24 DIAGNOSIS — R0600 Dyspnea, unspecified: Secondary | ICD-10-CM

## 2020-06-24 DIAGNOSIS — E785 Hyperlipidemia, unspecified: Secondary | ICD-10-CM | POA: Diagnosis not present

## 2020-06-24 DIAGNOSIS — R0609 Other forms of dyspnea: Secondary | ICD-10-CM

## 2020-06-24 NOTE — Progress Notes (Signed)
Cardiology Office Note:    Date:  06/24/2020   ID:  [name], DOB [birth year], MRN [id number]  PCP:  Daisy Florooss, Charles Alan, MD  Cardiologist:  No primary care provider on file.  Electrophysiologist:  None   Referring MD: Daisy Florooss, Charles Alan, MD   Chief Complaint  Patient presents with  . Shortness of Breath    History of Present Illness:    [name] is a 75 y.o. female with a hx of type 2 diabetes, hypertension, obesity who presents for follow-up.  She was referred by Dr. Tenny Crawoss for evaluation of shortness of breath, initially seen on 03/26/2019.  Patient reports that she has been short of breath with exertion.  Denies any exertional chest pain.  For [facility], [facility]  She walks for 15 to 20 minutes but has to take breaks.  States that walking up a flight of stairs causes her to get short of breath.  States that her sister, who is 76 years old, had a stress test which was positive and ultimately ended up undergoing PCI, which prompted her to seek an evaluation.  She states her blood pressure is usually under good control, 120s to 130s over 70s.  No smoking history.  Does report that she has been told she snores.  Lexiscan Myoview was done on 04/18/2019, which showed no evidence of ischemia or prior myocardial infarction.  TTE was done on 05/03/2019, which showed EF 65 to 70%, grade 1 diastolic dysfunction, normal RV function, no significant valvular disease.  Since last clinic visit, she reports that she is doing well.  Denies any chest pain, dyspnea, lightheadedness, syncope, lower extremity edema, or palpitations.  She started exercising at the gym 2 weeks ago, will do exercise machines.  Has been exercising for 45 minutes 3 times per week over last 2 weeks.  Reports BP has been 120s over 70s when she checks at home.  Past Medical History:  Diagnosis Date  . 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 Candida infection     fingernails  . Cholelithiasis   . Diabetes mellitus without complication (HCC) 06/2013   Type 2 NIDDM x 1 month  . Diverticulosis   . Gout   . H/O hepatitis 2011   viral  . Hearing loss   . Hepatitis   . Hypertension   . Menopause   . Obesity   . Prediabetes   . Shortness of breath    with exertion    Past Surgical History:  Procedure Laterality Date  . ABDOMINAL HYSTERECTOMY    . JOINT REPLACEMENT    . LUMBAR LAMINECTOMY/DECOMPRESSION MICRODISCECTOMY Right 07/20/2013   Procedure: LUMBAR LAMINECTOMY/DECOMPRESSION MICRODISCECTOMY RIGHT LUMBAR FOUR-FIVE;  Surgeon: Temple PaciniHenry A Pool, MD;  Location: [facility];  Service: Neurosurgery;  Laterality: Right;  . REPLACEMENT TOTAL KNEE BILATERAL     2007/2009    Current Medications: Current Meds  Medication Sig  . aspirin 81 MG tablet Take 81 mg by mouth daily.  . fish oil-omega-3 fatty acids 1000 MG capsule Take 1 g by mouth daily.  Marland Kitchen. lisinopril (PRINIVIL,ZESTRIL) 40 MG tablet Take 40 mg by mouth daily.  . Multiple Vitamin (MULTIVITAMIN PO) Take by mouth daily.  Marland Kitchen. oxybutynin (DITROPAN) 5 MG tablet Take 5 mg by mouth 2 (two) times daily. Take one tablet daily  . pravastatin (PRAVACHOL) 20 MG tablet Take 20 mg by mouth daily.  . verapamil (VERELAN PM) 240 MG  24 hr capsule Take 240 mg by mouth daily with breakfast.     Allergies:   Atorvastatin and Gabapentin   Social History   Socioeconomic History  . Marital status: Widowed    Spouse name: Not on file  . Number of children: Not on file  . Years of education: Not on file  . Highest education level: Not on file  Occupational History  . Not on file  Tobacco Use  . Smoking status: Never Smoker  . Smokeless tobacco: Never Used  Substance and Sexual Activity  . Alcohol use: Yes    Comment: occasional  . Drug use: No  . Sexual activity: Not on file  Other Topics Concern  . Not on file  Social History Narrative  . Not on file   Social Determinants of Health   Financial  Resource Strain: Not on file  Food Insecurity: Not on file  Transportation Needs: Not on file  Physical Activity: Not on file  Stress: Not on file  Social Connections: Not on file     Family History: The patient's family history includes Heart disease (age of onset: 94) in her mother; Stroke (age of onset: 10) in her father.  ROS:   Please see the history of present illness.     All other systems reviewed and are negative.  EKGs/Labs/Other Studies Reviewed:    The following studies were reviewed today:   EKG:  EKG is  ordered today.  The ekg ordered today demonstrates normal sinus rhythm, rate 71, poor R wave progression  TTE 05/03/2019: 1. Left ventricular ejection fraction, by visual estimation, is 65 to  70%. The left ventricle has normal function. There is no left ventricular  hypertrophy.  2. Left ventricular diastolic parameters are consistent with Grade I  diastolic dysfunction (impaired relaxation).  3. The left ventricle has no regional wall motion abnormalities.  4. Global right ventricle has normal systolic function.The right  ventricular size is normal. No increase in right ventricular wall  thickness.  5. Left atrial size was normal.  6. Right atrial size was normal.  7. Trivial pericardial effusion is present.  8. The mitral valve is normal in structure. No evidence of mitral valve  regurgitation.  9. The tricuspid valve is normal in structure.  10. The tricuspid valve is normal in structure. Tricuspid valve  regurgitation is trivial.  11. The aortic valve is tricuspid. Aortic valve regurgitation is trivial.  Mild aortic valve sclerosis without stenosis.  12. The pulmonic valve was not well visualized. Pulmonic valve  regurgitation is not visualized.  13. Normal pulmonary artery systolic pressure.     Lexiscan Myoview 04/18/2019:  Nuclear stress EF: 72%. The left ventricular ejection fraction is hyperdynamic (>65%).  There was no ST segment  deviation noted during stress.  This is a low risk study. There is no evidence of ischemia or previous myocardial infarction.  The study is normal.    Recent Labs: No results found for requested labs within last 8760 hours.  Recent Lipid Panel No results found for: CHOL, TRIG, HDL, CHOLHDL, VLDL, LDLCALC, LDLDIRECT  Physical Exam:    VS:  BP (!) 242/80   Pulse 71   Ht 5\' 1"  (1.549 m)   Wt 249 lb 3.2 oz (113 kg)   SpO2 95%   BMI 47.09 kg/m     Wt Readings from Last 3 Encounters:  06/24/20 249 lb 3.2 oz (113 kg)  06/25/19 266 lb 12.8 oz (121 kg)  04/17/19 258 lb (  117 kg)     GEN:  Well nourished, well developed in no acute distress HEENT: Normal NECK: No JVD; No carotid bruits LYMPHATICS: No lymphadenopathy CARDIAC: RRR, 2/6 systolic murmur loudest at RUSB RESPIRATORY:  Clear to auscultation without rales, wheezing or rhonchi  ABDOMEN: Soft, non-tender, non-distended MUSCULOSKELETAL:  No edema; No deformity  SKIN: Warm and dry NEUROLOGIC:  Alert and oriented x 3 PSYCHIATRIC:  Normal affect   ASSESSMENT:    1. Hyperlipidemia, unspecified hyperlipidemia type   2. DOE (dyspnea on exertion)   3. Essential hypertension    PLAN:     Dyspnea on exertion: Suspect related to deconditioning.  No evidence of ischemia on Lexiscan Myoview.  No structural heart disease on TTE.  Hypertension: On lisinopril 40 mg daily and verapamil 240 mg daily.  Mildly elevated in clinic today but reports has been under good control when she checks at home.  Continue to monitor  Hyperlipidemia: LDL 84 on 07/19/19.  10-year ASCVD risk score 39%.  Given diabetes and high risk ASCVD score, recommend higher intensity statin.  Started atorvastatin 40 mg daily in 03/2019, but patient states that she switched back to pravastatin and wishes to remain on that for now.  Recommend calcium score for further stratification.  If elevated calcium score, would switch to high intensity statin.  Type 2 diabetes:  A1c 6.8 on 12/22/2018, not currently on any medications  Snoring: Concern for OSA, sleep study ordered but she declines  RTC in 1 year   Medication Adjustments/Labs and Tests Ordered: Current medicines are reviewed at length with the patient today.  Concerns regarding medicines are outlined above.  Orders Placed This Encounter  Procedures  . CT CARDIAC SCORING (SELF PAY ONLY)  . EKG 12-Lead   No orders of the defined types were placed in this encounter.   Patient Instructions  Medication Instructions:  Your physician recommends that you continue on your current medications as directed. Please refer to the Current Medication list given to you today.  *If you need a refill on your cardiac medications before your next appointment, please call your pharmacy*  Testing/Procedures: CT coronary calcium score. This test is done at [address]. This is $99 out of pocket.   Coronary CalciumScan A coronary calcium scan is an imaging test used to look for deposits of calcium and other fatty materials (plaques) in the inner lining of the blood vessels of the heart (coronary arteries). These deposits of calcium and plaques can partly clog and narrow the coronary arteries without producing any symptoms or warning signs. This puts a person at risk for a heart attack. This test can detect these deposits before symptoms develop. Tell a health care provider about:  Any allergies you have.  All medicines you are taking, including vitamins, herbs, eye drops, creams, and over-the-counter medicines.  Any problems you or family members have had with anesthetic medicines.  Any blood disorders you have.  Any surgeries you have had.  Any medical conditions you have.  Whether you are pregnant or may be pregnant. What are the risks? Generally, this is a safe procedure. However, problems may occur, including:  Harm to a pregnant woman and her unborn baby. This test involves the use  of radiation. Radiation exposure can be dangerous to a pregnant woman and her unborn baby. If you are pregnant, you generally should not have this procedure done.  Slight increase in the risk of cancer. This is because of the radiation involved in  the test. What happens before the procedure? No preparation is needed for this procedure. What happens during the procedure?  You will undress and remove any jewelry around your neck or chest.  You will put on a hospital gown.  Sticky electrodes will be placed on your chest. The electrodes will be connected to an electrocardiogram (ECG) machine to record a tracing of the electrical activity of your heart.  A CT scanner will take pictures of your heart. During this time, you will be asked to lie still and hold your breath for 2-3 seconds while a picture of your heart is being taken. The procedure may vary among health care providers and hospitals. What happens after the procedure?  You can get dressed.  You can return to your normal activities.  It is up to you to get the results of your test. Ask your health care provider, or the department that is doing the test, when your results will be ready. Summary  A coronary calcium scan is an imaging test used to look for deposits of calcium and other fatty materials (plaques) in the inner lining of the blood vessels of the heart (coronary arteries).  Generally, this is a safe procedure. Tell your health care provider if you are pregnant or may be pregnant.  No preparation is needed for this procedure.  A CT scanner will take pictures of your heart.  You can return to your normal activities after the scan is done. This information is not intended to replace advice given to you by your health care provider. Make sure you discuss any questions you have with your health care provider. Document Released: 09/25/2007 Document Revised: 02/16/2016 Document Reviewed: 02/16/2016 Elsevier Interactive  Patient Education  2017 [employer].   Follow-Up: At [facility], you and your health needs are our priority.  As part of our continuing mission to provide you with exceptional heart care, we have created designated Provider Care Teams.  These Care Teams include your primary Cardiologist (physician) and Advanced Practice Providers (APPs -  Physician Assistants and Nurse Practitioners) who all work together to provide you with the care you need, when you need it.  We recommend signing up for the patient portal called "MyChart".  Sign up information is provided on this After Visit Summary.  MyChart is used to connect with patients for Virtual Visits (Telemedicine).  Patients are able to view lab/test results, encounter notes, upcoming appointments, etc.  Non-urgent messages can be sent to your provider as well.   To learn more about what you can do with MyChart, go to [email or web address].    Your next appointment:   12 month(s)  The format for your next appointment:   In Person  Provider:   Epifanio Lesches, MD         Signed, Little Ishikawa, MD  06/24/2020 1:58 PM    [facility]

## 2020-06-24 NOTE — Patient Instructions (Signed)
Medication Instructions:  Your physician recommends that you continue on your current medications as directed. Please refer to the Current Medication list given to you today.  *If you need a refill on your cardiac medications before your next appointment, please call your pharmacy*  Testing/Procedures: CT coronary calcium score. This test is done at [address]. This is $99 out of pocket.   Coronary CalciumScan A coronary calcium scan is an imaging test used to look for deposits of calcium and other fatty materials (plaques) in the inner lining of the blood vessels of the heart (coronary arteries). These deposits of calcium and plaques can partly clog and narrow the coronary arteries without producing any symptoms or warning signs. This puts a person at risk for a heart attack. This test can detect these deposits before symptoms develop. Tell a health care provider about: Any allergies you have. All medicines you are taking, including vitamins, herbs, eye drops, creams, and over-the-counter medicines. Any problems you or family members have had with anesthetic medicines. Any blood disorders you have. Any surgeries you have had. Any medical conditions you have. Whether you are pregnant or may be pregnant. What are the risks? Generally, this is a safe procedure. However, problems may occur, including: Harm to a pregnant woman and her unborn baby. This test involves the use of radiation. Radiation exposure can be dangerous to a pregnant woman and her unborn baby. If you are pregnant, you generally should not have this procedure done. Slight increase in the risk of cancer. This is because of the radiation involved in the test. What happens before the procedure? No preparation is needed for this procedure. What happens during the procedure? You will undress and remove any jewelry around your neck or chest. You will put on a hospital gown. Sticky electrodes will be placed on  your chest. The electrodes will be connected to an electrocardiogram (ECG) machine to record a tracing of the electrical activity of your heart. A CT scanner will take pictures of your heart. During this time, you will be asked to lie still and hold your breath for 2-3 seconds while a picture of your heart is being taken. The procedure may vary among health care providers and hospitals. What happens after the procedure? You can get dressed. You can return to your normal activities. It is up to you to get the results of your test. Ask your health care provider, or the department that is doing the test, when your results will be ready. Summary A coronary calcium scan is an imaging test used to look for deposits of calcium and other fatty materials (plaques) in the inner lining of the blood vessels of the heart (coronary arteries). Generally, this is a safe procedure. Tell your health care provider if you are pregnant or may be pregnant. No preparation is needed for this procedure. A CT scanner will take pictures of your heart. You can return to your normal activities after the scan is done. This information is not intended to replace advice given to you by your health care provider. Make sure you discuss any questions you have with your health care provider. Document Released: 09/25/2007 Document Revised: 02/16/2016 Document Reviewed: 02/16/2016 Elsevier Interactive Patient Education  2017 [employer]  Follow-Up: At [facility], you and your health needs are our priority.  As part of our continuing mission to provide you with exceptional heart care, we have created designated Provider Care Teams.  These Care Teams include your   primary Cardiologist (physician) and Advanced Practice Providers (APPs -  Physician Assistants and Nurse Practitioners) who all work together to provide you with the care you need, when you need it.  We recommend signing up for the patient portal called "MyChart".  Sign  up information is provided on this After Visit Summary.  MyChart is used to connect with patients for Virtual Visits (Telemedicine).  Patients are able to view lab/test results, encounter notes, upcoming appointments, etc.  Non-urgent messages can be sent to your provider as well.   To learn more about what you can do with MyChart, go to [email or web address].    Your next appointment:   12 month(s)  The format for your next appointment:   In Person  Provider:   Christopher Schumann, MD     

## 2020-07-23 ENCOUNTER — Ambulatory Visit (INDEPENDENT_AMBULATORY_CARE_PROVIDER_SITE_OTHER)
Admission: RE | Admit: 2020-07-23 | Discharge: 2020-07-23 | Disposition: A | Discharge status: Home / Self Care | Payer: Self-pay | Source: Ambulatory Visit | Attending: Cardiology | Admitting: Cardiology

## 2020-07-23 ENCOUNTER — Other Ambulatory Visit: Payer: Self-pay

## 2020-07-23 DIAGNOSIS — E785 Hyperlipidemia, unspecified: Secondary | ICD-10-CM

## 2020-07-31 ENCOUNTER — Telehealth: Payer: Self-pay | Admitting: Cardiology

## 2020-07-31 DIAGNOSIS — R911 Solitary pulmonary nodule: Secondary | ICD-10-CM

## 2020-07-31 MED ORDER — ATORVASTATIN CALCIUM 40 MG PO TABS: 40.0000 mg | ORAL_TABLET | Freq: Every day | ORAL | 3 refills | Status: DC

## 2020-07-31 NOTE — Telephone Encounter (Signed)
Little Ishikawa, MD  07/27/2020 1:56 PM EDT      Calcium score 907 (96 percentile for age/gender). Stress test last year was negative for ischemia. Recommend switching from pravastatin to atorvastatin 40 mg daily.  Also with lung nodule, recommend follow-up chest CT in 1 year.     Patient aware and verbalized understanding.  Prescription sent to pharmacy and order placed for repeat CT in 1 year.

## 2020-07-31 NOTE — Telephone Encounter (Signed)
Pt is returning call regarding her results from her latest CT Scan

## 2020-11-04 ENCOUNTER — Other Ambulatory Visit: Payer: Self-pay | Admitting: Family Medicine

## 2020-11-04 DIAGNOSIS — Z1231 Encounter for screening mammogram for malignant neoplasm of breast: Secondary | ICD-10-CM

## 2020-11-14 ENCOUNTER — Other Ambulatory Visit: Payer: Self-pay

## 2020-11-14 DIAGNOSIS — I739 Peripheral vascular disease, unspecified: Secondary | ICD-10-CM

## 2020-11-17 NOTE — Progress Notes (Signed)
[facility] [place]  ASSESSMENT / PLAN: [name] is a 75 y.o. female with no evidence of hemodynamically significant peripheral arterial disease on clinical exam or non-invasive testing. Follow up with me as needed.  CHIEF COMPLAINT: abnormal screening exam.  HISTORY OF PRESENT ILLNESS: [name] is a 75 y.o. female referred to clinic for evaluation of peripheral arterial disease.  The patient had a visiting nurse assessment which included some form of noninvasive vascular testing.  This suggested the patient had peripheral arterial disease.  The patient reports no difficulty ambulating.  She has no cramping pain in her calves when she walks.  She has no pain at rest.  She has no ulceration.  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   Candida infection    fingernails   Cholelithiasis    Diabetes mellitus without complication (HCC) 06/2013   Type 2 NIDDM x 1 month   Diverticulosis    Gout    H/O hepatitis 2011   viral   Hearing loss    Hepatitis    Hypertension    Menopause    Obesity    Prediabetes    Shortness of breath    with exertion    Past Surgical History:  Procedure Laterality Date   ABDOMINAL HYSTERECTOMY     JOINT REPLACEMENT     LUMBAR LAMINECTOMY/DECOMPRESSION MICRODISCECTOMY Right 07/20/2013   Procedure: LUMBAR LAMINECTOMY/DECOMPRESSION MICRODISCECTOMY RIGHT LUMBAR FOUR-FIVE;  Surgeon: Temple PaciniHenry A Pool, MD;  Location: [facility];  Service: Neurosurgery;  Laterality: Right;   REPLACEMENT TOTAL KNEE BILATERAL     2007/2009    Family History  Problem Relation Age of Onset   Heart disease Mother 5864       heart attack   Stroke Father 3464    Social History   Socioeconomic History   Marital status: Widowed    Spouse name: Not on file   Number of children: Not on file   Years of education: Not on file   Highest education level: Not on file  Occupational History   Not on file  Tobacco  Use   Smoking status: Never   Smokeless tobacco: Never  Vaping Use   Vaping Use: Never used  Substance and Sexual Activity   Alcohol use: Yes    Comment: occasional   Drug use: No   Sexual activity: Not on file  Other Topics Concern   Not on file  Social History Narrative   Not on file   Social Determinants of Health   Financial Resource Strain: Not on file  Food Insecurity: Not on file  Transportation Needs: Not on file  Physical Activity: Not on file  Stress: Not on file  Social Connections: Not on file  Intimate Partner Violence: Not on file    Allergies  Allergen Reactions   Atorvastatin Other (See Comments)   Gabapentin Other (See Comments)    Caused muscle pain all over    Current Outpatient Medications  Medication Sig Dispense Refill   aspirin 81 MG tablet Take 81 mg by mouth daily.     fish oil-omega-3 fatty acids 1000 MG capsule Take 1 g by mouth daily.     lisinopril (PRINIVIL,ZESTRIL) 40 MG tablet Take 40 mg by mouth daily.  4   Multiple Vitamin (MULTIVITAMIN PO) Take by mouth daily.     oxybutynin (DITROPAN) 5 MG tablet Take 5 mg by mouth 2 (two) times daily. Take one  tablet daily     pravastatin (PRAVACHOL) 20 MG tablet Take 20 mg by mouth every other day.     verapamil (VERELAN PM) 240 MG 24 hr capsule Take 240 mg by mouth daily with breakfast.     No current facility-administered medications for this visit.    REVIEW OF SYSTEMS:  [X]  denotes positive finding, [ ]  denotes negative finding Cardiac  Comments:  Chest pain or chest pressure:    Shortness of breath upon exertion:    Short of breath when lying flat:    Irregular heart rhythm:        Vascular    Pain in calf, thigh, or hip brought on by ambulation:    Pain in feet at night that wakes you up from your sleep:     Blood clot in your veins:    Leg swelling:         Pulmonary    Oxygen at home:    Productive cough:     Wheezing:         Neurologic    Sudden weakness in arms or legs:      Sudden numbness in arms or legs:     Sudden onset of difficulty speaking or slurred speech:    Temporary loss of vision in one eye:     Problems with dizziness:         Gastrointestinal    Blood in stool:     Vomited blood:         Genitourinary    Burning when urinating:     Blood in urine:        Psychiatric    Major depression:         Hematologic    Bleeding problems:    Problems with blood clotting too easily:        Skin    Rashes or ulcers:        Constitutional    Fever or chills:      PHYSICAL EXAM Vitals:   11/18/20 1435  BP: 133/66  Pulse: 72  Resp: 20  Temp: 98.1 F (36.7 C)  SpO2: 98%  Weight: 249 lb (112.9 kg)  Height: 5\' 1"  (1.549 m)    Constitutional: well appearing. no distress. Appears well nourished.  Neurologic: CN intact. no focal findings. no sensory loss. Psychiatric: Mood and affect symmetric and appropriate. Eyes:  No icterus. No conjunctival pallor. Ears, nose, throat:  mucous membranes moist. Midline trachea.  Cardiac: regular rate and rhythm.  Respiratory:  unlabored. Abdominal:  soft, non-tender, non-distended.  Peripheral vascular: 2+ DP pulses bilaterally Extremity: no edema. no cyanosis. no pallor.  Skin: no gangrene. no ulceration.  Lymphatic: no Stemmer's sign. no palpable lymphadenopathy.  PERTINENT LABORATORY AND RADIOLOGIC DATA  Most recent CBC CBC Latest Ref Rng & Units 09/01/2017 07/16/2013 12/22/2010  WBC 4.0 - 10.5 K/uL 10.5 10.5 11.1(H)  Hemoglobin 12.0 - 15.0 g/dL 09/03/2017 09/15/2013 02/21/2011  Hematocrit 36.0 - 46.0 % 39.7 41.1 41.6  Platelets 150 - 400 K/uL 229 204 240     Most recent CMP CMP Latest Ref Rng & Units 09/01/2017 07/16/2013 12/22/2010  Glucose 65 - 99 mg/dL 09/03/2017) 95 09/15/2013)  BUN 6 - 20 mg/dL 16 21 17   Creatinine 0.44 - 1.00 mg/dL 02/21/2011 527(P 824(M  Sodium 135 - 145 mmol/L 142 143 143  Potassium 3.5 - 5.1 mmol/L 3.9 4.5 3.8  Chloride 101 - 111 mmol/L 107 104 103  CO2 22 - 32 mmol/L 26 24  32  Calcium 8.9 - 10.3  mg/dL 9.7 9.6 10.9  Total Protein 6.5 - 8.1 g/dL 7.6 - 7.3  Total Bilirubin 0.3 - 1.2 mg/dL 0.6 - 0.8  Alkaline Phos 38 - 126 U/L 62 - 80  AST 15 - 41 U/L 27 - 18  ALT 14 - 54 U/L 21 - 15   LOWER EXTREMITY DOPPLER STUDY   Patient Name:  [name]  Date of Exam:   11/18/2020  Medical Rec #: [id number]        Accession #:    [id number]  Date of Birth: 1945/07/17        Patient Gender: F  Patient Age:   9 years  Exam Location:  [facility]  Procedure:      VAS [place] ABI WITH/WO TBI  Referring Phys: Maisie Fus Toneisha Savary    ---------------------------------------------------------------------------  -----     Indications: Abnormal POCT screening ABI.     Performing Technologist: Thereasa Parkin RVT      Examination Guidelines: A complete evaluation includes at minimum, Doppler  waveform signals and systolic blood pressure reading at the level of  bilateral  brachial, anterior tibial, and posterior tibial arteries, when vessel  segments  are accessible. Bilateral testing is considered an integral part of a  complete  examination. Photoelectric Plethysmograph (PPG) waveforms and toe systolic  pressure readings are included as required and additional duplex testing  as  needed. Limited examinations for reoccurring indications may be performed  as  noted.      ABI Findings:  +---------+------------------+-----+---------+--------+  Right    Rt Pressure (mmHg)IndexWaveform Comment   +---------+------------------+-----+---------+--------+  Brachial 126                                       +---------+------------------+-----+---------+--------+  PTA      150               1.19 triphasic          +---------+------------------+-----+---------+--------+  DP       143               1.13 triphasic          +---------+------------------+-----+---------+--------+  Great Toe90                0.71                     +---------+------------------+-----+---------+--------+   +---------+------------------+-----+---------+-------+  Left     Lt Pressure (mmHg)IndexWaveform Comment  +---------+------------------+-----+---------+-------+  Brachial 102                                      +---------+------------------+-----+---------+-------+  PTA      157               1.25 triphasic         +---------+------------------+-----+---------+-------+  DP       152               1.21 triphasic         +---------+------------------+-----+---------+-------+  Great Toe88                0.70                   +---------+------------------+-----+---------+-------+   +-------+-----------+-----------+------------+------------+  ABI/TBIToday's ABIToday's TBIPrevious ABIPrevious  TBI  +-------+-----------+-----------+------------+------------+  Right  1.19       0.71       0.63                      +-------+-----------+-----------+------------+------------+  Left   1.25       0.7        0.9                       +-------+-----------+-----------+------------+------------+         Previous exam POCT ABI on 10/17/20.     Summary:  Right: Resting right ankle-brachial index is within normal range. No  evidence of significant right lower extremity arterial disease. The right  toe-brachial index is normal.   Left: Resting left ankle-brachial index is within normal range. No  evidence of significant left lower extremity arterial disease. The left  toe-brachial index is normal.       *See table(s) above for measurements and observations.       Electronically signed by Heath Lark on 11/18/2020 at 2:48:15 PM.   Rande Brunt. Lenell Antu, MD [facility] Phone Number: [phone] 11/18/2020 4:49 PM

## 2020-11-18 ENCOUNTER — Encounter: Payer: Self-pay | Admitting: Vascular Surgery

## 2020-11-18 ENCOUNTER — Ambulatory Visit (INDEPENDENT_AMBULATORY_CARE_PROVIDER_SITE_OTHER): Payer: Medicare Other | Admitting: Vascular Surgery

## 2020-11-18 ENCOUNTER — Ambulatory Visit (HOSPITAL_COMMUNITY)
Admission: RE | Admit: 2020-11-18 | Discharge: 2020-11-18 | Disposition: A | Discharge status: Home / Self Care | Payer: Medicare Other | Source: Ambulatory Visit | Attending: Vascular Surgery | Admitting: Vascular Surgery

## 2020-11-18 ENCOUNTER — Other Ambulatory Visit: Payer: Self-pay

## 2020-11-18 VITALS — BP 133/66 | HR 72 | Temp 98.1°F | Resp 20 | Ht 61.0 in | Wt 249.0 lb

## 2020-11-18 DIAGNOSIS — Z0000 Encounter for general adult medical examination without abnormal findings: Secondary | ICD-10-CM | POA: Diagnosis not present

## 2020-11-18 DIAGNOSIS — I739 Peripheral vascular disease, unspecified: Secondary | ICD-10-CM

## 2020-12-24 ENCOUNTER — Other Ambulatory Visit: Payer: Self-pay

## 2020-12-24 ENCOUNTER — Ambulatory Visit
Admission: RE | Admit: 2020-12-24 | Discharge: 2020-12-24 | Disposition: A | Discharge status: Home / Self Care | Payer: Medicare Other | Source: Ambulatory Visit | Attending: Family Medicine | Admitting: Family Medicine

## 2020-12-24 DIAGNOSIS — Z1231 Encounter for screening mammogram for malignant neoplasm of breast: Secondary | ICD-10-CM

## 2021-07-09 ENCOUNTER — Ambulatory Visit (INDEPENDENT_AMBULATORY_CARE_PROVIDER_SITE_OTHER)
Admission: RE | Admit: 2021-07-09 | Discharge: 2021-07-09 | Disposition: A | Discharge status: Home / Self Care | Payer: Medicare Other | Source: Ambulatory Visit | Attending: Cardiology | Admitting: Cardiology

## 2021-07-09 DIAGNOSIS — R911 Solitary pulmonary nodule: Secondary | ICD-10-CM | POA: Diagnosis not present

## 2021-07-14 NOTE — Progress Notes (Signed)
?Cardiology Office Note:   ? ?Date:  07/15/2021  ? ?ID:  [name], DOB Oct 14, 1945, MRN [id number] ? ?PCP:  Daisy Floro, MD  ?Cardiologist:  None  ?Electrophysiologist:  None  ? ?Referring MD: Daisy Floro, MD  ? ?Chief Complaint  ?Patient presents with  ? Shortness of Breath  ? ? ?History of Present Illness:   ? ?[name] is a 76 y.o. female with a hx of type 2 diabetes, hypertension, obesity who presents for follow-up.  She was referred by Dr. Tenny Craw for evaluation of shortness of breath, initially seen on 03/26/2019.  Patient reports that she has been short of breath with exertion.  Denies any exertional chest pain.  For exercise, she reports that she goes to [facility] about twice per week.  She walks for 15 to 20 minutes but has to take breaks.  States that walking up a flight of stairs causes her to get short of breath.  States that her sister, who is 43 years old, had a stress test which was positive and ultimately ended up undergoing PCI, which prompted her to seek an evaluation.  She states her blood pressure is usually under good control, 120s to 130s over 70s.  No smoking history.  Does report that she has been told she snores. ? ?Lexiscan Myoview was done on 04/18/2019, which showed no evidence of ischemia or prior myocardial infarction.  TTE was done on 05/03/2019, which showed EF 65 to 70%, grade 1 diastolic dysfunction, normal RV function, no significant valvular disease.  Calcium score 907 on 07/23/2020 (96 percentile). ? ?Since last clinic visit, she reports that she has been doing okay.  Denies any chest pain, lightheadedness, syncope, lower extremity edema, or palpitations.  Does report she has been having dyspnea with exertion ? ?Past Medical History:  ?Diagnosis Date  ? Arthritis   ? Osteoarthritis in knees  ? Candida infection   ? fingernails  ? Cholelithiasis   ? Diabetes mellitus without complication (HCC) 06/2013  ? Type 2 NIDDM x 1 month  ? Diverticulosis   ? Gout    ? H/O hepatitis 2011  ? viral  ? Hearing loss   ? Hepatitis   ? Hypertension   ? Menopause   ? Obesity   ? Prediabetes   ? Shortness of breath   ? with exertion  ? ? ?Past Surgical History:  ?Procedure Laterality Date  ? ABDOMINAL HYSTERECTOMY    ? JOINT REPLACEMENT    ? LUMBAR LAMINECTOMY/DECOMPRESSION MICRODISCECTOMY Right 07/20/2013  ? Procedure: LUMBAR LAMINECTOMY/DECOMPRESSION MICRODISCECTOMY RIGHT LUMBAR FOUR-FIVE;  Surgeon: Temple Pacini, MD;  Location: [facility];  Service: Neurosurgery;  Laterality: Right;  ? REPLACEMENT TOTAL KNEE BILATERAL    ? 2007/2009  ? ? ?Current Medications: ?Current Meds  ?Medication Sig  ? aspirin 81 MG tablet Take 81 mg by mouth daily.  ? fish oil-omega-3 fatty acids 1000 MG capsule Take 1 g by mouth daily.  ? lisinopril (PRINIVIL,ZESTRIL) 40 MG tablet Take 40 mg by mouth daily.  ? Multiple Vitamin (MULTIVITAMIN PO) Take by mouth daily.  ? oxybutynin (DITROPAN) 5 MG tablet Take 5 mg by mouth 2 (two) times daily. Take one tablet daily  ? pravastatin (PRAVACHOL) 20 MG tablet Take 20 mg by mouth every other day.  ? rosuvastatin (CRESTOR) 5 MG tablet Take 1 tablet (5 mg total) by mouth daily.  ? verapamil (VERELAN PM) 240 MG 24 hr capsule Take 240 mg by mouth daily with breakfast.  ?  ? ?  Allergies:   Atorvastatin and Gabapentin  ? ?Social History  ? ?Socioeconomic History  ? Marital status: Widowed  ?  Spouse name: Not on file  ? Number of children: Not on file  ? Years of education: Not on file  ? Highest education level: Not on file  ?Occupational History  ? Not on file  ?Tobacco Use  ? Smoking status: Never  ? Smokeless tobacco: Never  ?Vaping Use  ? Vaping Use: Never used  ?Substance and Sexual Activity  ? Alcohol use: Yes  ?  Comment: occasional  ? Drug use: No  ? Sexual activity: Not on file  ?Other Topics Concern  ? Not on file  ?Social History Narrative  ? Not on file  ? ?Social Determinants of Health  ? ?Financial Resource Strain: Not on file  ?Food Insecurity: Not on file   ?Transportation Needs: Not on file  ?Physical Activity: Not on file  ?Stress: Not on file  ?Social Connections: Not on file  ?  ? ?Family History: ?The patient's family history includes Heart disease (age of onset: 8264) in her mother; Stroke (age of onset: 2864) in her father. ? ?ROS:   ?Please see the history of present illness.    ? All other systems reviewed and are negative. ? ?EKGs/Labs/Other Studies Reviewed:   ? ?The following studies were reviewed today: ? ? ?EKG:   ?07/15/21: Normal sinus rhythm, rate 73, Q waves in V3, III ? ?TTE 05/03/2019: ? 1. Left ventricular ejection fraction, by visual estimation, is 65 to  ?70%. The left ventricle has normal function. There is no left ventricular  ?hypertrophy.  ? 2. Left ventricular diastolic parameters are consistent with Grade I  ?diastolic dysfunction (impaired relaxation).  ? 3. The left ventricle has no regional wall motion abnormalities.  ? 4. Global right ventricle has normal systolic function.The right  ?ventricular size is normal. No increase in right ventricular wall  ?thickness.  ? 5. Left atrial size was normal.  ? 6. Right atrial size was normal.  ? 7. Trivial pericardial effusion is present.  ? 8. The mitral valve is normal in structure. No evidence of mitral valve  ?regurgitation.  ? 9. The tricuspid valve is normal in structure.  ?10. The tricuspid valve is normal in structure. Tricuspid valve  ?regurgitation is trivial.  ?11. The aortic valve is tricuspid. Aortic valve regurgitation is trivial.  ?Mild aortic valve sclerosis without stenosis.  ?12. The pulmonic valve was not well visualized. Pulmonic valve  ?regurgitation is not visualized.  ?13. Normal pulmonary artery systolic pressure.  ? ? ? ?Lexiscan Myoview 04/18/2019: ?Nuclear stress EF: 72%. The left ventricular ejection fraction is hyperdynamic (>65%). ?There was no ST segment deviation noted during stress. ?This is a low risk study. There is no evidence of ischemia or previous myocardial  infarction. ?The study is normal. ? ? ? ?Recent Labs: ?No results found for requested labs within last 8760 hours.  ?Recent Lipid Panel ?No results found for: CHOL, TRIG, HDL, CHOLHDL, VLDL, LDLCALC, LDLDIRECT ? ?Physical Exam:   ? ?VS:  BP 140/80   Pulse 73   Ht 5\' 1"  (1.549 m)   Wt 253 lb (114.8 kg)   SpO2 95%   BMI 47.80 kg/m?    ? ?Wt Readings from Last 3 Encounters:  ?07/15/21 253 lb (114.8 kg)  ?11/18/20 249 lb (112.9 kg)  ?06/24/20 249 lb 3.2 oz (113 kg)  ?  ? ?GEN:  Well nourished, well developed in no acute distress ?  HEENT: Normal ?NECK: No JVD; No carotid bruits ?LYMPHATICS: No lymphadenopathy ?CARDIAC: RRR, 2/6 systolic murmur loudest at RUSB ?RESPIRATORY:  Clear to auscultation without rales, wheezing or rhonchi  ?ABDOMEN: Soft, non-tender, non-distended ?MUSCULOSKELETAL:  No edema; No deformity  ?SKIN: Warm and dry ?NEUROLOGIC:  Alert and oriented x 3 ?PSYCHIATRIC:  Normal affect  ? ?ASSESSMENT:   ? ?1. DOE (dyspnea on exertion)   ?2. Essential hypertension   ?3. Hyperlipidemia, unspecified hyperlipidemia type   ?4. Class 3 severe obesity with body mass index (BMI) of 45.0 to 49.9 in adult, unspecified obesity type, unspecified whether serious comorbidity present (HCC)   ?5. Snoring   ?6. ILD (interstitial lung disease) (HCC)   ?7. Coronary artery disease involving native coronary artery of native heart without angina pectoris   ? ? ?PLAN:   ? ?Dyspnea on exertion: Suspect related to deconditioning.  No evidence of ischemia on Lexiscan Myoview.  No structural heart disease on TTE.  CT chest does show possible interstitial lung disease, which could be contributing.  Will refer to pulmonology ? ?Hypertension: On lisinopril 40 mg daily and verapamil 240 mg daily.  Mildly elevated in clinic today.  Recommend checking BP daily for next 2 weeks and bring log to pharmacy hypertension clinic appointment.  Asked to bring home monitor to calibrate. ? ?Hyperlipidemia: LDL 84 on 07/19/19.  10-year ASCVD risk  score 39%.  Given diabetes and high risk ASCVD score, recommend higher intensity statin.  Started atorvastatin 40 mg daily in 03/2019, but patient switched back to pravastatin.  Calcium score 907 on 07/23/2020 (96 pe

## 2021-07-15 ENCOUNTER — Ambulatory Visit: Payer: Medicare Other | Admitting: Cardiology

## 2021-07-15 ENCOUNTER — Encounter: Payer: Self-pay | Admitting: Cardiology

## 2021-07-15 VITALS — BP 140/80 | HR 73 | Ht 61.0 in | Wt 253.0 lb

## 2021-07-15 DIAGNOSIS — I10 Essential (primary) hypertension: Secondary | ICD-10-CM | POA: Diagnosis not present

## 2021-07-15 DIAGNOSIS — R0683 Snoring: Secondary | ICD-10-CM

## 2021-07-15 DIAGNOSIS — R0609 Other forms of dyspnea: Secondary | ICD-10-CM

## 2021-07-15 DIAGNOSIS — Z6841 Body Mass Index (BMI) 40.0 and over, adult: Secondary | ICD-10-CM

## 2021-07-15 DIAGNOSIS — I2510 Atherosclerotic heart disease of native coronary artery without angina pectoris: Secondary | ICD-10-CM

## 2021-07-15 DIAGNOSIS — E785 Hyperlipidemia, unspecified: Secondary | ICD-10-CM | POA: Diagnosis not present

## 2021-07-15 DIAGNOSIS — J849 Interstitial pulmonary disease, unspecified: Secondary | ICD-10-CM

## 2021-07-15 MED ORDER — ROSUVASTATIN CALCIUM 5 MG PO TABS: 5.0000 mg | ORAL_TABLET | Freq: Every day | ORAL | 3 refills | Status: AC

## 2021-07-15 NOTE — Patient Instructions (Addendum)
Medication Instructions:  ?STOP pravastatin  ?START rosuvastatin (Crestor) 5 mg daily ? ?*If you need a refill on your cardiac medications before your next appointment, please call your pharmacy* ? ?Testing/Procedures: ?Your physician has recommended that you have a sleep study. This test records several body functions during sleep, including: brain activity, eye movement, oxygen and carbon dioxide blood levels, heart rate and rhythm, breathing rate and rhythm, the flow of air through your mouth and nose, snoring, body muscle movements, and chest and belly movement. ? ?Follow-Up: ?At [facility] , you and your health needs are our priority.  As part of our continuing mission to provide you with exceptional heart care, we have created designated Provider Care Teams.  These Care Teams include your primary Cardiologist (physician) and Advanced Practice Providers (APPs -  Physician Assistants and Nurse Practitioners) who all work together to provide you with the care you need, when you need it. ? ?We recommend signing up for the patient portal called "MyChart".  Sign up information is provided on this After Visit Summary.  MyChart is used to connect with patients for Virtual Visits (Telemedicine).  Patients are able to view lab/test results, encounter notes, upcoming appointments, etc.  Non-urgent messages can be sent to your provider as well.   ?To learn more about what you can do with MyChart, go to [email or web address].   ? ?Your next appointment:   ?6 month(s) ? ?The format for your next appointment:   ?In Person ? ?Provider:   ?Dr. Bjorn Pippin ? ?Other Instructions ?You have been referred to: Pulmonology  ?You have been referred to: PharmD-bring blood pressure cuff to appointment  ? ? ? ?

## 2021-07-31 ENCOUNTER — Ambulatory Visit: Payer: Medicare Other

## 2021-08-24 ENCOUNTER — Encounter: Payer: Self-pay | Admitting: Internal Medicine

## 2021-08-24 ENCOUNTER — Ambulatory Visit: Payer: Medicare Other | Admitting: Internal Medicine

## 2021-08-24 VITALS — BP 132/80 | HR 70 | Temp 98.1°F | Ht 61.0 in | Wt 255.4 lb

## 2021-08-24 DIAGNOSIS — R9389 Abnormal findings on diagnostic imaging of other specified body structures: Secondary | ICD-10-CM | POA: Diagnosis not present

## 2021-08-24 DIAGNOSIS — R918 Other nonspecific abnormal finding of lung field: Secondary | ICD-10-CM

## 2021-08-24 NOTE — Patient Instructions (Signed)
ICD-10-CM   ?1. Multiple lung nodules on CT  R91.8   ?  ?2. Abnormal CT of the chest  R93.89   ?  ? ? ?Do blood work for [employer], ANA, RF, CCP, DS-DNA, ssa, ssb ?Do HRCT supine and prone sometime in  July-Aug 2023 ?Do full PFT at time of next followup ? ?Followup ?July-Aug 2023 with Dr Marchelle Gearing to reviewe results ?

## 2021-08-24 NOTE — Progress Notes (Signed)
? ? ? ? ?OV 08/24/2021 ? ?Subjective:  ?Patient ID: Colleen BarryBibi Z Holland, female , DOB: 03/31/1946 , age 76 y.o. , MRN: [id number] , ADDRESS: [address] ?[place] [id number] ?PCP Daisy Florooss, Charles Alan, MD ?Patient Care Team: ?Daisy Florooss, Charles Alan, MD as PCP - General (Family Medicine) ? ?This Provider for this visit: Treatment Team:  ?Attending Provider: Kalman Shanamaswamy, Reggie Bise, MD ? ? ? ?08/24/2021 -   ?Chief Complaint  ?Patient presents with  ? Consult  ?  Pt is being referred due to recent CT she had done.  Pt denies any current complaints.  ? ? ? 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  [place].  Used to work in [employer] for [employer].  Then in 2000 she was widowed.  In 2003 she quit her job and then relocated to [place] to be with her son.  Her son runs and owns [employer] franch[employer] in town.  She says she lives by herself.  There is no shortness of breath.  No cough no wheezing no chest pain no paroxysmal nocturnal dyspnea no orthopnea [sleeps with 2 pillows].  In April 2022 she had CT scan coronary that showed 96 percentile of coronary calcification but also some lung nodules.  This has been followed up in March 2023 with CT chest without contrast for the lung nodules [she thinks she is being followed for coronary calcification].  Multiple lung nodules with the largest 7 mm noted.  In addition possible ILD noted.  And also small hiatal hernia.  She does not have acid reflux.  Therefore she has been referred here. ? ? ? ?CT Chest data wo contrast 3/0/23 ? ?CLINICAL DATA:  Follow-up pulmonary nodule. ?  ?EXAM: ?CT CHEST WITHOUT CONTRAST ?  ?TECHNIQUE: ?Multidetector CT imaging of the chest was performed following the ?standard protocol without IV contrast. ?  ?RADIATION DOSE REDUCTION: This exam was performed according to the ?departmental dose-optimization program which includes automated ?exposure control, adjustment of the mA and/or kV according to ?patient size  and/or use of iterative reconstruction technique. ?  ?COMPARISON:  CT coronary artery calcium score 07/23/2020. ?  ?FINDINGS: ?Cardiovascular: No significant vascular findings. Normal heart size. ?No pericardial effusion. There are atherosclerotic calcifications of ?the aorta and coronary arteries. ?  ?Mediastinum/Nodes: No enlarged mediastinal or axillary lymph nodes. ?Thyroid gland, trachea, and esophagus demonstrate no significant ?findings. ?  ?Lungs/Pleura: There is a peripheral 3 mm nodular density in the ?right upper lobe image 3/42 there is a 7 mm peripheral nodular ?density in the medial right upper lobe image 3/71 (nodule in ?question on prior CT). There is a 6 mm right lower lobe nodule image ?3/79. There is a 5 mm right lower lobe nodule image 3/62. There is a ?7 mm left lower lobe nodule image 3/111. There is some scattered ?peripheral interstitial opacities in the lung bases similar to the ?prior study. Chronic appearing paraseptal emphysematous change in ?the right lower lobe. There is some scarring in the lung apices. ?  ?Upper Abdomen: There is a small hiatal hernia. The gallbladder is ?surgically absent. ?  ?Musculoskeletal: Multilevel degenerative changes affect the spine. ?  ?IMPRESSION: ?1. Multiple pulmonary nodules. Most severe: 7 mm left solid ?pulmonary nodule. Recommend a non-contrast Chest CT at 3-6 months, ?then another non-contrast Chest CT at 18-24 months. ?These guidelines do not apply to immunocompromised patients and ?patients with cancer. Follow up in patients with significant ?comorbidities as clinically warranted. For lung  cancer screening, ?adhere to Lung-RADS guidelines. Reference: Radiology. 2017; ?284(1):228-43. ?2. Scattered interstitial opacities in the lung bases are ?nonspecific and may represent scarring or interstitial lung disease. ?Attention on follow-up examination recommended. ?3.  Aortic Atherosclerosis (ICD10-I70.0). ?  ?  ?Electronically Signed ?  By: Darliss Cheney M.D. ?  On: 07/10/2021 17:08 ? ?No results found. ? ? ? ?PFT ? ?   ? View : No data to display.  ?  ?  ?  ? ? ? ? ? has a past medical history of Arthritis, Candida infection, Cholelithiasis, Diabetes mellitus without complication (HCC) (06/2013), Diverticulosis, Gout, H/O hepatitis (2011), Hearing loss, Hepatitis, Hypertension, Menopause, Obesity, Prediabetes, and Shortness of breath. ? ? reports that she has never smoked. She has never used smokeless tobacco. ? ?Past Surgical History:  ?Procedure Laterality Date  ? ABDOMINAL HYSTERECTOMY    ? JOINT REPLACEMENT    ? LUMBAR LAMINECTOMY/DECOMPRESSION MICRODISCECTOMY Right 07/20/2013  ? Procedure: LUMBAR LAMINECTOMY/DECOMPRESSION MICRODISCECTOMY RIGHT LUMBAR FOUR-FIVE;  Surgeon: Temple Pacini, MD;  Location: [facility];  Service: Neurosurgery;  Laterality: Right;  ? REPLACEMENT TOTAL KNEE BILATERAL    ? 2007/2009  ? ? ?Allergies  ?Allergen Reactions  ? Atorvastatin Other (See Comments)  ? Gabapentin Other (See Comments)  ?  Caused muscle pain all over  ? ? ?Immunization History  ?Administered Date(s) Administered  ? Fluad Quad(high Dose 65+) 02/24/2016  ? Influenza, High Dose Seasonal PF 02/01/2014, 02/04/2015, 02/03/2017, 02/10/2018, 02/09/2019, 01/24/2020, 01/27/2021  ? PFIZER(Purple Top)SARS-COV-2 Vaccination 06/16/2019, 07/07/2019, 01/29/2020, 08/09/2020, 04/24/2021  ? Pneumococcal Conjugate-13 06/21/2018  ? Pneumococcal Polysaccharide-23 12/02/2009  ? Td 10/01/2002  ? ? ?Family History  ?Problem Relation Age of Onset  ? Heart disease Mother 71  ?     heart attack  ? Stroke Father 43  ? ? ? ?Current Outpatient Medications:  ?  aspirin 81 MG tablet, Take 81 mg by mouth daily., Disp: , Rfl:  ?  fish oil-omega-3 fatty acids 1000 MG capsule, Take 1 g by mouth daily., Disp: , Rfl:  ?  levothyroxine (SYNTHROID) 25 MCG tablet, 1 tablet in the morning on an empty stomach, Disp: , Rfl:  ?  lisinopril (PRINIVIL,ZESTRIL) 40 MG tablet, Take 40 mg by mouth daily.,  Disp: , Rfl: 4 ?  Multiple Vitamin (MULTIVITAMIN PO), Take by mouth daily., Disp: , Rfl:  ?  oxybutynin (DITROPAN) 5 MG tablet, Take 5 mg by mouth 2 (two) times daily. Take one tablet daily, Disp: , Rfl:  ?  rosuvastatin (CRESTOR) 5 MG tablet, Take 1 tablet (5 mg total) by mouth daily., Disp: 90 tablet, Rfl: 3 ?  verapamil (VERELAN PM) 240 MG 24 hr capsule, Take 240 mg by mouth daily with breakfast., Disp: , Rfl:  ? ? ?   ?Objective:  ? ?Vitals:  ? 08/24/21 1348  ?BP: 132/80  ?Pulse: 70  ?Temp: 98.1 ?F (36.7 ?C)  ?TempSrc: Oral  ?SpO2: 98%  ?Weight: 255 lb 6.4 oz (115.8 kg)  ?Height: 5\' 1"  (1.549 m)  ? ? ?Estimated body mass index is 48.26 kg/m? as calculated from the following: ?  Height as of this encounter: 5\' 1"  (1.549 m). ?  Weight as of this encounter: 255 lb 6.4 oz (115.8 kg). ? ?@WEIGHTCHANGE @ ? ?Filed Weights  ? 08/24/21 1348  ?Weight: 255 lb 6.4 oz (115.8 kg)  ? ? ? Physical Exam ?General: No distress. obese ?Neuro: Alert and Oriented x 3. GCS 15. Speech normal ?Psych: Pleasant ?Resp:  Barrel Chest - no.  Wheeze - no, Crackles - no, No overt respiratory distress ?CVS: Normal heart sounds. Murmurs - no ?Ext: Stigmata of Connective Tissue Disease - no ?HEENT: Normal upper airway. PEERL +. No post nasal drip ? ? ? ? ?   ?Assessment:  ?   ?  ICD-10-CM   ?1. Multiple lung nodules on CT  R91.8 QuantiFERON-TB Gold Plus  ?  ANA  ?  Rheumatoid factor  ?  Cyclic citrul peptide antibody, IgG  ?  Anti-DNA antibody, double-stranded  ?  Sjogren's syndrome antibods(ssa + ssb)  ?  Pulmonary function test  ?  CT Chest High Resolution  ?  Sjogren's syndrome antibods(ssa + ssb)  ?  Anti-DNA antibody, double-stranded  ?  Cyclic citrul peptide antibody, IgG  ?  Rheumatoid factor  ?  ANA  ?  QuantiFERON-TB Gold Plus  ?  ?2. Abnormal CT of the chest  R93.89 QuantiFERON-TB Gold Plus  ?  ANA  ?  Rheumatoid factor  ?  Cyclic citrul peptide antibody, IgG  ?  Anti-DNA antibody, double-stranded  ?  Sjogren's syndrome antibods(ssa +  ssb)  ?  Pulmonary function test  ?  CT Chest High Resolution  ?  Sjogren's syndrome antibods(ssa + ssb)  ?  Anti-DNA antibody, double-stranded  ?  Cyclic citrul peptide antibody, IgG  ?  Rheumatoid factor  ?  ANA  ?

## 2021-08-26 LAB — RHEUMATOID FACTOR: Rheumatoid fact SerPl-aCnc: <14 IU/mL (ref <14)

## 2021-08-26 LAB — SJOGREN'S SYNDROME ANTIBODS(SSA + SSB)
SSA (Ro) (ENA) Antibody, IgG: <1 AI
SSB (La) (ENA) Antibody, IgG: <1 AI

## 2021-08-26 LAB — CYCLIC CITRUL PEPTIDE ANTIBODY, IGG: Cyclic Citrullin Peptide Ab: <16 UNITS

## 2021-08-26 LAB — ANTI-DNA ANTIBODY, DOUBLE-STRANDED: ds DNA Ab: <1 IU/mL

## 2021-08-26 LAB — ANA: Anti Nuclear Antibody (ANA): NEGATIVE

## 2021-08-27 LAB — QUANTIFERON-TB GOLD PLUS
Mitogen-NIL: >10 IU/mL
NIL: 0.04 IU/mL
QuantiFERON-TB Gold Plus: NEGATIVE
TB1-NIL: 0 IU/mL
TB2-NIL: 0 IU/mL

## 2021-09-05 IMAGING — CT CT CARDIAC CORONARY ARTERY CALCIUM SCORE
3 series · 14 of 20 positions shown, 15 images · non-contrast
Comparison: 09/01/2017 chest radiograph.
COMPARISON: 09/01/2017 chest radiograph.

Addendum:
EXAM:
OVER-READ INTERPRETATION  CT CHEST

The following report is an over-read performed by radiologist Dr.
Iyamboh Nangaku [REDACTED] on 07/23/2020. This over-read
does not include interpretation of cardiac or coronary anatomy or
pathology. The calcium score interpretation by the cardiologist is
attached.
CLINICAL DATA: Cardiovascular Disease Risk stratification
Coronary Calcium Score
TECHNIQUE: A gated, non-contrast computed tomography scan of the heart was
performed using 3mm slice thickness. Axial images were analyzed on a
dedicated workstation. Calcium scoring of the coronary arteries was
performed using the Agatston method.

[Series 2: casc 3.0 bv41 2 bestdiast 72 % · axial · 0.38mm/px · z∈[-220,-148]mm · 4 of 42 slices shown, 5 images]
[im 9/42  vessel]
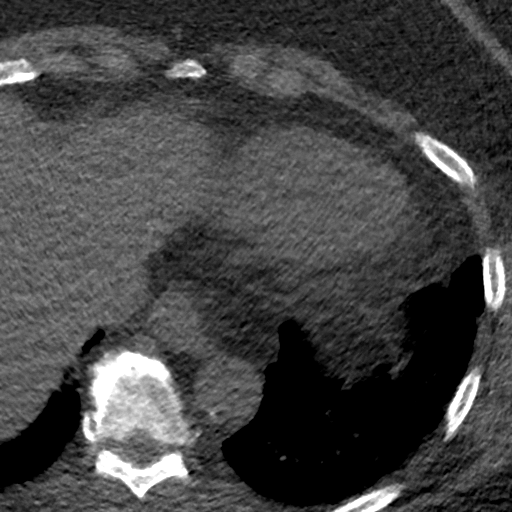
[im 9/42  lung]
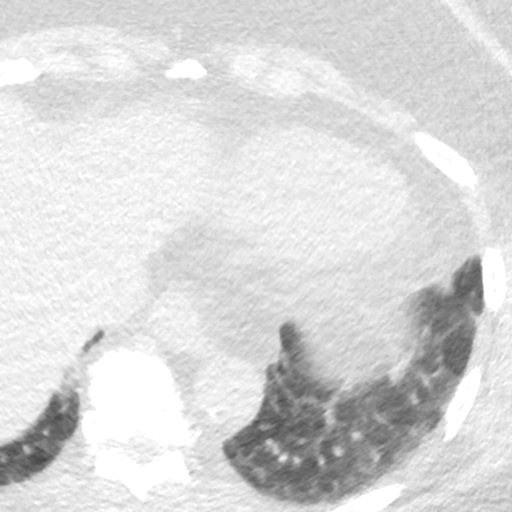
[im 17/42  vessel]
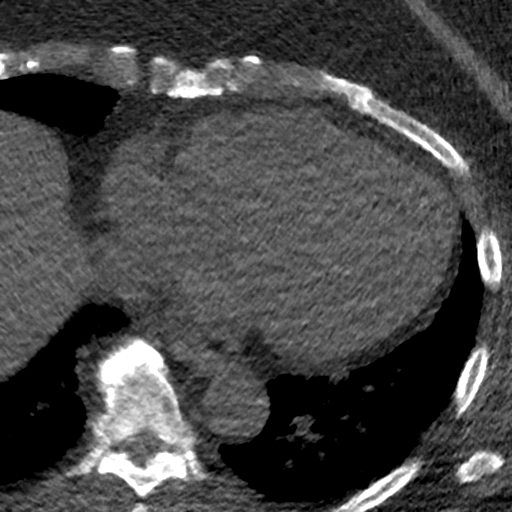
[im 25/42  vessel]
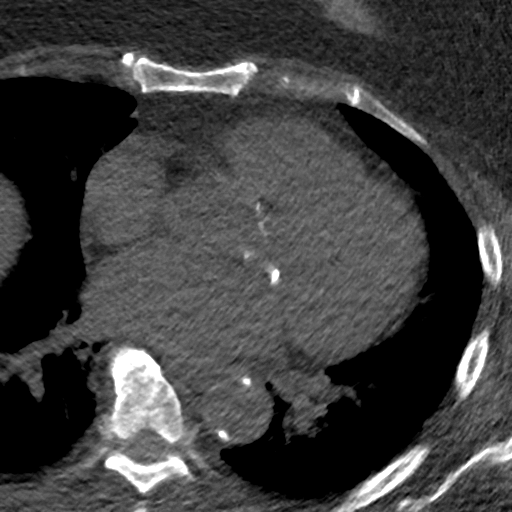
[im 33/42  vessel]
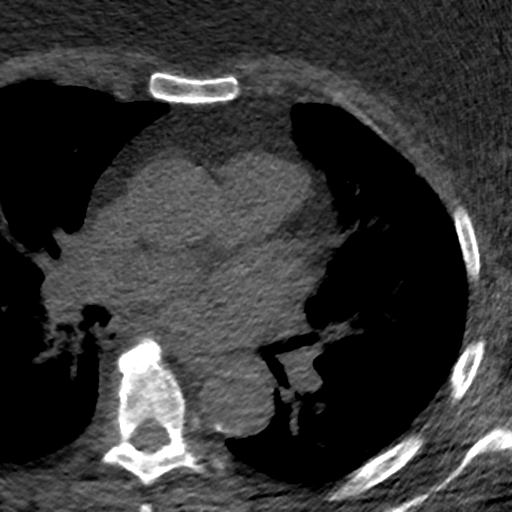

[Series 3: lung 71 % · axial · 0.70mm/px · z∈[-226,-142]mm · 5 of 42 slices shown]
[im 7/42  lung]
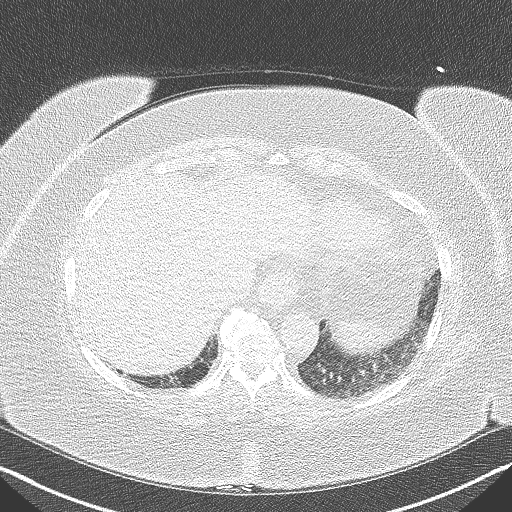
[im 14/42  lung]
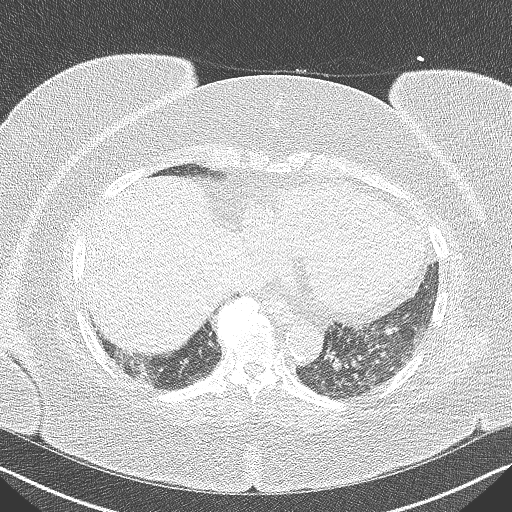
[im 21/42  lung]
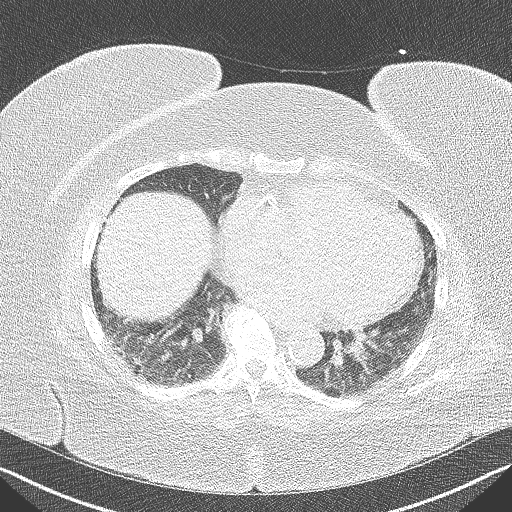
[im 28/42  lung]
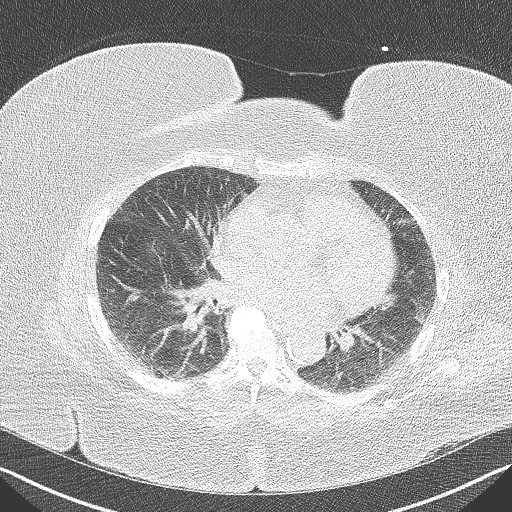
[im 35/42  lung]
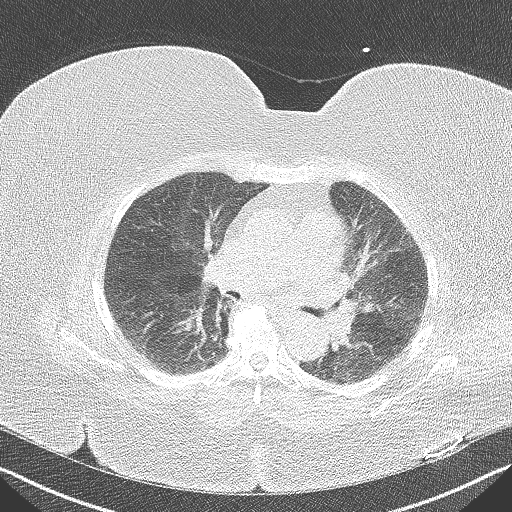

[Series 4: lung st 71 % · axial · 0.70mm/px · z∈[-226,-142]mm · 5 of 42 slices shown]
[im 7/42  lung]
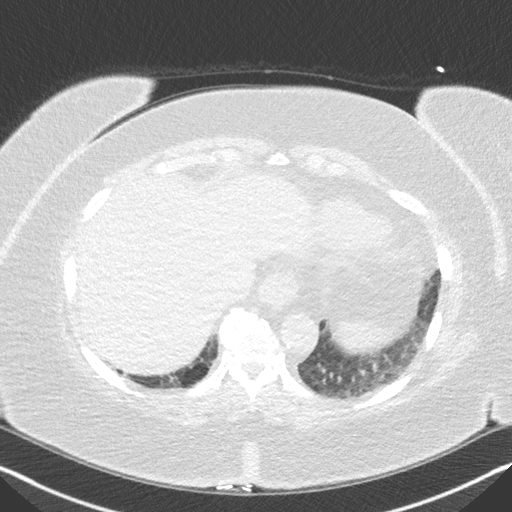
[im 14/42  lung]
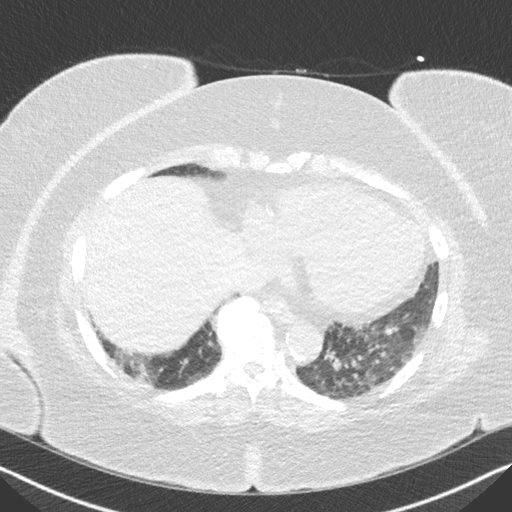
[im 21/42  lung]
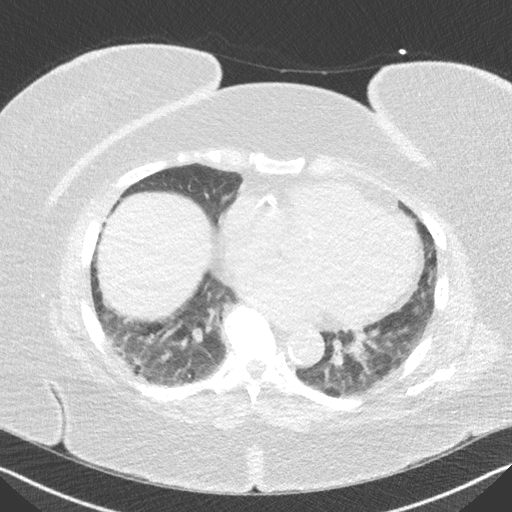
[im 28/42  lung]
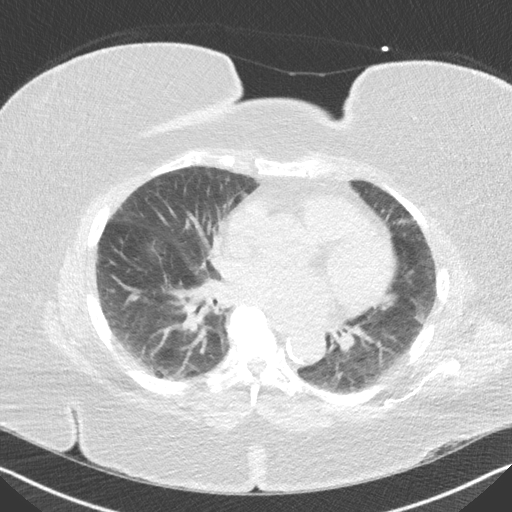
[im 35/42  lung]
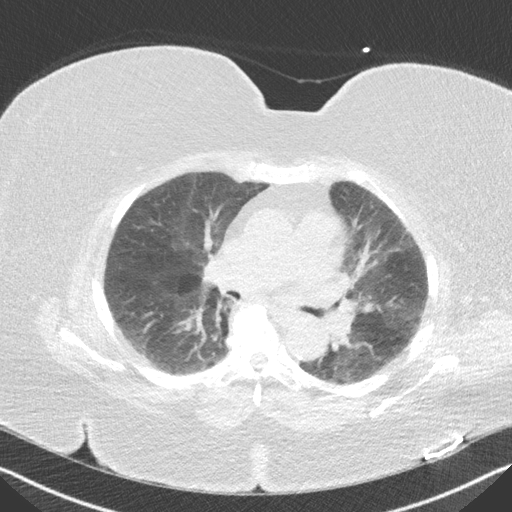

[14 of 20 positions shown; findings below may reference images not displayed]

FINDINGS: Vascular: Degradation secondary to patient body habitus. Aortic
atherosclerosis.

Mediastinum/Nodes: No imaged thoracic adenopathy. Tiny hiatal
hernia.

Lungs/Pleura: No pleural fluid. Medial right upper lobe 5 mm
pulmonary nodule on [DATE].

Probable subpleural right lower lobe 3 mm nodule on [DATE]. right
hemidiaphragm elevation is mild.

Upper Abdomen: Normal imaged portions of the liver, spleen.

Musculoskeletal: Advanced thoracic spondylosis.
IMPRESSION: 1. Mild degradation secondary to patient body habitus.
2. At least 1 right-sided pulmonary nodule, maximally 5 mm. No
follow-up needed if patient is low-risk. Non-contrast chest CT can
be considered in 12 months if patient is high-risk. This
recommendation follows the consensus statement: Guidelines for
Management of Incidental Pulmonary Nodules Detected on CT Images:
3.  Aortic Atherosclerosis (F8INL-JJ2.2).
4.  Tiny hiatal hernia.
FINDINGS: Coronary arteries: Normal origins.

Coronary Calcium Score:

Left main: 4

Left anterior descending artery: 194

Left circumflex artery: 9

Right coronary artery: 575

Total: 907

Percentile: 96

Pericardium: Normal.

Aorta: Normal caliber of ascending aorta. Aortic atherosclerosis
noted.

Non-cardiac: See separate report from [REDACTED].
IMPRESSION: Coronary calcium score of 907. This was 96 percentile for age-,
race-, and sex-matched controls.



If CAC=0, it is reasonable to withhold statin therapy and reassess
in 5 to 10 years, as long as higher risk conditions are absent
(diabetes mellitus, family history of premature CHD in first degree
relatives (males <55 years; females <65 years), cigarette smoking,
or LDL >=190 mg/dL).

If CAC is 1 to 99, it is reasonable to initiate statin therapy for
patients >=55 years of age.

If CAC is >=100 or >=75th percentile, it is reasonable to initiate
statin therapy at any age.

Cardiology referral should be considered for patients with CAC
scores >=400 or >=75th percentile.

*1371 AHA/ACC/AACVPR/AAPA/ABC/BART/SEJAS/JEUK/Shantal/DJIBRIL/HARSHAD/ZEINAB
Guideline on the Management of Blood Cholesterol: A Report of the
American College of Cardiology/American Heart Association Task Force
on Clinical Practice Guidelines. J Am Coll Cardiol.
4646;73(24):5762-5147.

*** End of Addendum ***
EXAM:
OVER-READ INTERPRETATION  CT CHEST

The following report is an over-read performed by radiologist Dr.
Iyamboh Nangaku [REDACTED] on 07/23/2020. This over-read
does not include interpretation of cardiac or coronary anatomy or
pathology. The calcium score interpretation by the cardiologist is
attached.
FINDINGS: Vascular: Degradation secondary to patient body habitus. Aortic
atherosclerosis.

Mediastinum/Nodes: No imaged thoracic adenopathy. Tiny hiatal
hernia.

Lungs/Pleura: No pleural fluid. Medial right upper lobe 5 mm
pulmonary nodule on [DATE].

Probable subpleural right lower lobe 3 mm nodule on [DATE]. right
hemidiaphragm elevation is mild.

Upper Abdomen: Normal imaged portions of the liver, spleen.

Musculoskeletal: Advanced thoracic spondylosis.
IMPRESSION: 1. Mild degradation secondary to patient body habitus.
2. At least 1 right-sided pulmonary nodule, maximally 5 mm. No
follow-up needed if patient is low-risk. Non-contrast chest CT can
be considered in 12 months if patient is high-risk. This
recommendation follows the consensus statement: Guidelines for
Management of Incidental Pulmonary Nodules Detected on CT Images:
3.  Aortic Atherosclerosis (F8INL-JJ2.2).
4.  Tiny hiatal hernia.

## 2021-09-14 ENCOUNTER — Encounter (HOSPITAL_BASED_OUTPATIENT_CLINIC_OR_DEPARTMENT_OTHER): Payer: Medicare Other | Admitting: Cardiovascular Disease

## 2021-11-12 ENCOUNTER — Telehealth: Payer: Self-pay | Admitting: Internal Medicine

## 2021-11-12 NOTE — Telephone Encounter (Signed)
I called & got pt's CT rescheduled to 8/10 at 2:00.  Spoke to pt & gave her appt info.  Nothing further needed.

## 2021-11-19 ENCOUNTER — Ambulatory Visit
Admission: RE | Admit: 2021-11-19 | Discharge: 2021-11-19 | Disposition: A | Discharge status: Home / Self Care | Payer: Medicare Other | Source: Ambulatory Visit | Attending: Internal Medicine | Admitting: Internal Medicine

## 2021-11-19 DIAGNOSIS — R918 Other nonspecific abnormal finding of lung field: Secondary | ICD-10-CM

## 2021-11-19 DIAGNOSIS — R9389 Abnormal findings on diagnostic imaging of other specified body structures: Secondary | ICD-10-CM

## 2021-11-27 ENCOUNTER — Encounter: Payer: Self-pay | Admitting: Internal Medicine

## 2021-11-27 ENCOUNTER — Ambulatory Visit: Payer: Medicare Other | Admitting: Internal Medicine

## 2021-11-27 ENCOUNTER — Ambulatory Visit (INDEPENDENT_AMBULATORY_CARE_PROVIDER_SITE_OTHER): Payer: Medicare Other | Admitting: Internal Medicine

## 2021-11-27 VITALS — BP 126/72 | HR 74 | Ht 61.0 in | Wt 247.0 lb

## 2021-11-27 DIAGNOSIS — R9389 Abnormal findings on diagnostic imaging of other specified body structures: Secondary | ICD-10-CM

## 2021-11-27 DIAGNOSIS — R918 Other nonspecific abnormal finding of lung field: Secondary | ICD-10-CM

## 2021-11-27 DIAGNOSIS — K449 Diaphragmatic hernia without obstruction or gangrene: Secondary | ICD-10-CM

## 2021-11-27 DIAGNOSIS — J849 Interstitial pulmonary disease, unspecified: Secondary | ICD-10-CM | POA: Diagnosis not present

## 2021-11-27 LAB — PULMONARY FUNCTION TEST
DL/VA % pred: 105 %
DL/VA: 4.43 ml/min/mmHg/L
DLCO cor % pred: 69 %
DLCO cor: 11.89 ml/min/mmHg
DLCO unc % pred: 69 %
DLCO unc: 11.89 ml/min/mmHg

## 2021-11-27 NOTE — Patient Instructions (Addendum)
ICD-10-CM   1. ILD (interstitial lung disease) (HCC)  J84.9     2. Multiple lung nodules on CT  R91.8     3. Hiatal hernia  K44.9         Very mild/early interstitial lung disease Small hiatal hernia with her acid reflux Very mild/early interstitial lung disease  Overall very minimal symptom burden of shortness of breath mostly attributable to weight   Plan  - Recommend supportive care and serial follow-up -Agree that biopsy is too invasive and risk does not outweigh the benefit -Do repeat spirometry and DLCO [we can give 1 more attempt] in 8 months -Repeat high-resolution CT scan of the chest in 16 months  - stop fish oil due to hiatal hernia -Make sure you get get all of all vaccines  followup 8 months to see Dr. Marchelle Gearing; simple walking desaturation test and symptom score at follow-up

## 2021-11-27 NOTE — Progress Notes (Signed)
No chrge

## 2021-11-27 NOTE — Progress Notes (Signed)
OV 08/24/2021  Subjective:  Patient ID: [name], female , DOB: 04-16-1945 , age 76 y.o. , MRN: [id number] , ADDRESS: [address] [place] [address] PCP Daisy Floro, MD Patient Care Team: Daisy Floro, MD as PCP - General (Family Medicine)  This Provider for this visit: Treatment Team:  Attending Provider: Kalman Shan, MD    08/24/2021 -   Chief Complaint  Patient presents with   Consult    Pt is being referred due to recent CT she had done.  Pt denies any current complaints.     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  Immigrated to the [place].  Used to work in [employer] for [employer].  Then in 2000 she was widowed.  In 2003 she quit her job and then relocated to [place] to be with her son.  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  She says she lives by herself.  There is no shortness of breath.  No cough no wheezing no chest pain no paroxysmal nocturnal dyspnea no orthopnea [sleeps with 2 pillows].  In April 2022 she had CT scan coronary that showed 96 percentile of coronary calcification but also some lung nodules.  This has been followed up in March 2023 with CT chest without contrast for the lung nodules [she thinks she is being followed for coronary calcification].  Multiple lung nodules with the largest 7 mm noted.  In addition possible ILD noted.  And also small hiatal hernia.  She does not have acid reflux.  Therefore she has been referred here.    CT Chest data wo contrast 3/0/23  CLINICAL DATA:  Follow-up pulmonary nodule.   EXAM: CT CHEST WITHOUT CONTRAST   TECHNIQUE: Multidetector CT imaging of the chest was performed following the standard protocol without IV contrast.   RADIATION DOSE REDUCTION: This exam was performed according to the departmental dose-optimization program which includes automated exposure control, adjustment of the mA and/or kV according to patient size and/or use  of iterative reconstruction technique.   COMPARISON:  CT coronary artery calcium score 07/23/2020.   FINDINGS: Cardiovascular: No significant vascular findings. Normal heart size. No pericardial effusion. There are atherosclerotic calcifications of the aorta and coronary arteries.   Mediastinum/Nodes: No enlarged mediastinal or axillary lymph nodes. Thyroid gland, trachea, and esophagus demonstrate no significant findings.   Lungs/Pleura: There is a peripheral 3 mm nodular density in the right upper lobe image 3/42 there is a 7 mm peripheral nodular density in the medial right upper lobe image 3/71 (nodule in question on prior CT). There is a 6 mm right lower lobe nodule image 3/79. There is a 5 mm right lower lobe nodule image 3/62. There is a 7 mm left lower lobe nodule image 3/111. There is some scattered peripheral interstitial opacities in the lung bases similar to the prior study. Chronic appearing paraseptal emphysematous change in the right lower lobe. There is some scarring in the lung apices.   Upper Abdomen: There is a small hiatal hernia. The gallbladder is surgically absent.   Musculoskeletal: Multilevel degenerative changes affect the spine.   IMPRESSION: 1. Multiple pulmonary nodules. Most severe: 7 mm left solid pulmonary nodule. Recommend a non-contrast Chest CT at 3-6 months, then another non-contrast Chest CT at 18-24 months. These guidelines do not apply to immunocompromised patients and patients with cancer. Follow up in patients with significant comorbidities as clinically warranted. For lung cancer screening, adhere to  Lung-RADS guidelines. Reference: Radiology. 2017; 284(1):228-43. 2. Scattered interstitial opacities in the lung bases are nonspecific and may represent scarring or interstitial lung disease. Attention on follow-up examination recommended. 3.  Aortic Atherosclerosis (ICD10-I70.0).     Electronically Signed   By: Darliss Cheney M.D.    On: 07/10/2021 17:08  No results found.         OV 11/27/2021  Subjective:  Patient ID: [name], female , DOB: 06-Oct-1945 , age 30 y.o. , MRN: [id number] , ADDRESS: [address] [place] [address] PCP Daisy Floro, MD Patient Care Team: Daisy Floro, MD as PCP - General (Family Medicine)  This Provider for this visit: Treatment Team:  Attending Provider: Kalman Shan, MD    11/27/2021 -   Chief Complaint  Patient presents with   Follow-up    PFT performed today.  Pt states she has been doing okay since last visit and denies any complaints.     HPI [name] 76 y.o. -returns for follow-up.  There was concern for ILD.  She had high-resolution CT chest and there is some mild early ILD.  She had pulmonary function test but could not perform it adequately.  She says she barely has any symptoms but then admitted that she has some shortness of breath with exertion which she attributes to her significant obesity there is no cough or wheezing orthopnea paroxysmal nocturnal dyspnea hemoptysis.  She had serology was negative QuantiFERON gold was negative.  CT scans also showed a small nodule/nodules.  Requires follow-up in 14-20 months.  Also has hiatal hernia but she denies any acid reflux.    SYMPTOM SCALE - ILD 11/27/2021  Current weight   O2 use ra  Shortness of Breath 0 -> 5 scale with 5 being worst (score 6 If unable to do)  At rest 0  Simple tasks - showers, clothes change, eating, shaving 1  Household (dishes, doing bed, laundry) 2  Shopping 2  Walking level at own pace 3  Walking up Stairs 4  Total (30-36) Dyspnea Score 12  How bad is your cough? 0  How bad is your fatigue 2  How bad is nausea 0  How bad is vomiting?  0  How bad is diarrhea? 00  How bad is anxiety? 00  How bad is depression 0  Any chronic pain - if so where and how bad 0  6 mm lung nodule history of non-smoking   CT Chest data  No results  found.    PFT     Latest Ref Rng & Units 11/27/2021    2:51 PM  PFT Results  DLCO uncorrected ml/min/mmHg 11.89  P  DLCO UNC% % 69  P  DLCO corrected ml/min/mmHg 11.89  P  DLCO COR %Predicted % 69  P  DLVA Predicted % 105  P    P Preliminary result    HRCT 11/19/2021  Narrative & Impression  CLINICAL DATA:  Follow-up pulmonary nodules.   EXAM: CT CHEST WITHOUT CONTRAST   TECHNIQUE: Multidetector CT imaging of the chest was performed following the standard protocol without intravenous contrast. High resolution imaging of the lungs, as well as inspiratory and expiratory imaging, was performed.   RADIATION DOSE REDUCTION: This exam was performed according to the departmental dose-optimization program which includes automated exposure control, adjustment of the mA and/or kV according to patient size and/or use of iterative reconstruction technique.   COMPARISON:  07/09/2021 chest CT.   FINDINGS: Cardiovascular: Normal heart  size. No significant pericardial effusion/thickening. Three-vessel coronary atherosclerosis. Atherosclerotic nonaneurysmal thoracic aorta. Normal caliber pulmonary arteries.   Mediastinum/Nodes: No discrete thyroid nodules. Unremarkable esophagus. No pathologically enlarged axillary, mediastinal or hilar lymph nodes, noting limited sensitivity for the detection of hilar adenopathy on this noncontrast study.   Lungs/Pleura: No pneumothorax. No pleural effusion. No acute consolidative airspace disease or lung masses. A few scattered solid pulmonary nodules in both lungs, largest 0.6 cm in the posterior right lower lobe (series 9/image 76), all stable since 07/09/2021 chest CT using similar measurement technique. No new significant pulmonary nodules. Mild to moderate patchy air trapping throughout both lungs on the expiration sequence without definite findings of tracheobronchomalacia. Mild patchy scattered subpleural reticulation and mild patchy  ground-glass opacity throughout both lungs with associated scattered mild regions of traction bronchiectasis. No frank honeycombing. Slight basilar predominance to these findings. No appreciable interval progression.   Upper abdomen: Small to moderate hiatal hernia. Cholecystectomy. Mild left colonic diverticulosis.   Musculoskeletal: No aggressive appearing focal osseous lesions. Marked thoracic spondylosis.   IMPRESSION: 1. Scattered small solid pulmonary nodules, largest 0.6 cm, all stable since 07/09/2021 chest CT, probably benign. Final follow-up noncontrast chest CT may be considered in 14-20 months. This recommendation follows the consensus statement: Guidelines for Management of Incidental Pulmonary Nodules Detected on CT Images: From the Fleischner Society 2017; Radiology 2017; 284:228-243. 2. Spectrum of findings suggestive of mild basilar predominant fibrotic interstitial lung disease without frank honeycombing and without appreciable interval progression. Mild-to-moderate patchy air trapping indicative of small airways disease. Considerations include fibrotic NSIP or chronic hypersensitivity pneumonitis. Findings are suggestive of an alternative diagnosis (not UIP) per consensus guidelines: Diagnosis of Idiopathic Pulmonary Fibrosis: An Official ATS/ERS/JRS/ALAT Clinical Practice Guideline. Am Rosezetta SchlatterJ Respir Crit Care Med Vol 198, Iss 5, [phone], Dec 11 2016. 3. Three-vessel coronary atherosclerosis. 4. Small to moderate hiatal hernia. 5. Mild left colonic diverticulosis. 6. Aortic Atherosclerosis (ICD10-I70.0).     Electronically Signed   By: Delbert PhenixJason A Poff M.D.   On: 11/20/2021 12:37    Latest Reference Range & Units 08/24/21 14:37  Anti Nuclear Antibody (ANA) NEGATIVE  NEGATIVE  Cyclic Citrullin Peptide Ab UNITS <16  ds DNA Ab IU/mL <1  RA Latex Turbid. <14 IU/mL <14  SSA (Ro) (ENA) Antibody, IgG <1.0 NEG AI <1.0 NEG  SSB (La) (ENA) Antibody, IgG <1.0 NEG AI <1.0  NEG  QUANTIFERON-TB GOLD PLUS  Rpt  Mitogen-NIL IU/mL >10.00  Rpt: View report in Results Review for more information   has a past medical history of Arthritis, Candida infection, Cholelithiasis, Diabetes mellitus without complication (HCC) (06/2013), Diverticulosis, Gout, H/O hepatitis (2011), Hearing loss, Hepatitis, Hypertension, Menopause, Obesity, Prediabetes, and Shortness of breath.   reports that she has never smoked. She has never used smokeless tobacco.  Past Surgical History:  Procedure Laterality Date   ABDOMINAL HYSTERECTOMY     JOINT REPLACEMENT     LUMBAR LAMINECTOMY/DECOMPRESSION MICRODISCECTOMY Right 07/20/2013   Procedure: LUMBAR LAMINECTOMY/DECOMPRESSION MICRODISCECTOMY RIGHT LUMBAR FOUR-FIVE;  Surgeon: Temple PaciniHenry A Pool, MD;  Location: [facility];  Service: Neurosurgery;  Laterality: Right;   REPLACEMENT TOTAL KNEE BILATERAL     2007/2009    Allergies  Allergen Reactions   Atorvastatin Other (See Comments)   Gabapentin Other (See Comments)    Caused muscle pain all over    Immunization History  Administered Date(s) Administered   Fluad Quad(high Dose 65+) 02/24/2016   Influenza, High Dose Seasonal PF 02/01/2014, 02/04/2015, 02/03/2017, 02/10/2018, 02/09/2019, 01/24/2020, 01/27/2021   PFIZER(Purple Top)SARS-COV-2  Vaccination 06/16/2019, 07/07/2019, 01/29/2020, 08/09/2020, 04/24/2021   Pneumococcal Conjugate-13 06/21/2018   Pneumococcal Polysaccharide-23 12/02/2009   Td 10/01/2002    Family History  Problem Relation Age of Onset   Heart disease Mother 92       heart attack   Stroke Father 64     Current Outpatient Medications:    aspirin 81 MG tablet, Take 81 mg by mouth daily., Disp: , Rfl:    fish oil-omega-3 fatty acids 1000 MG capsule, Take 1 g by mouth daily., Disp: , Rfl:    levothyroxine (SYNTHROID) 25 MCG tablet, 1 tablet in the morning on an empty stomach, Disp: , Rfl:    lisinopril (PRINIVIL,ZESTRIL) 40 MG tablet, Take 40 mg by mouth daily.,  Disp: , Rfl: 4   Multiple Vitamin (MULTIVITAMIN PO), Take by mouth daily., Disp: , Rfl:    oxybutynin (DITROPAN) 5 MG tablet, Take 5 mg by mouth 2 (two) times daily. Take one tablet daily, Disp: , Rfl:    verapamil (VERELAN PM) 240 MG 24 hr capsule, Take 240 mg by mouth daily with breakfast., Disp: , Rfl:    rosuvastatin (CRESTOR) 5 MG tablet, Take 1 tablet (5 mg total) by mouth daily., Disp: 90 tablet, Rfl: 3      Objective:   Vitals:   11/27/21 1554  BP: 126/72  Pulse: 74  SpO2: 99%  Weight: 247 lb (112 kg)  Height: 5\' 1"  (1.549 m)    Estimated body mass index is 46.67 kg/m as calculated from the following:   Height as of this encounter: 5\' 1"  (1.549 m).   Weight as of this encounter: 247 lb (112 kg).  @WEIGHTCHANGE @    11/27/21 1554  Weight: 247 lb (112 kg)     Physical Exam  General: No distress. obese Neuro: Alert and Oriented x 3. GCS 15. Speech normal Psych: Pleasant Resp:  Barrel Chest - no.  Wheeze - no, Crackles - no, No overt respiratory distress CVS: Normal heart sounds. Murmurs - no Ext: Stigmata of Connective Tissue Disease - no HEENT: Normal upper airway. PEERL +. No post nasal drip        Assessment:       ICD-10-CM   1. ILD (interstitial lung disease) (HCC)  J84.9 Pulmonary function test    2. Multiple lung nodules on CT  R91.8     3. Hiatal hernia  K44.9          Plan:     Patient Instructions     ICD-10-CM   1. ILD (interstitial lung disease) (HCC)  J84.9     2. Multiple lung nodules on CT  R91.8     3. Hiatal hernia  K44.9         Very mild/early interstitial lung disease Small hiatal hernia with her acid reflux Very mild/early interstitial lung disease  Overall very minimal symptom burden of shortness of breath mostly attributable to weight   Plan  - Recommend supportive care and serial follow-up -Agree that biopsy is too invasive and risk does not outweigh the benefit -Do repeat spirometry and DLCO  [we can give 1 more attempt] in 8 months -Repeat high-resolution CT scan of the chest in 16 months  - stop fish oil due to hiatal hernia -Make sure you get get all of all vaccines  followup 8 months to see Dr. ; simple walking desaturation test and symptom score at follow-up    SIGNATURE    Dr. [employer], M.D., F.C.C.P,  Pulmonary  and Critical Care Medicine Staff Physician, [facility] Center Director - Interstitial Lung Disease  Program  Pulmonary Fibrosis [facility] at [facility] [place], [place], [address]  Pager: [phone], If no answer or between  15:00h - 7:00h: call [phone] Telephone: [phone]  4:22 PM 11/27/2021

## 2021-11-30 ENCOUNTER — Other Ambulatory Visit: Payer: Medicare Other

## 2023-01-07 ENCOUNTER — Other Ambulatory Visit: Payer: Self-pay | Admitting: Family Medicine

## 2023-01-07 DIAGNOSIS — M7662 Achilles tendinitis, left leg: Secondary | ICD-10-CM

## 2023-06-27 ENCOUNTER — Encounter: Payer: Self-pay | Admitting: Internal Medicine

## 2024-01-19 ENCOUNTER — Ambulatory Visit: Attending: Cardiology | Admitting: Internal Medicine

## 2024-01-19 ENCOUNTER — Encounter: Payer: Self-pay | Admitting: Internal Medicine

## 2024-01-19 ENCOUNTER — Encounter: Payer: Self-pay | Admitting: Cardiovascular Disease

## 2024-01-19 VITALS — BP 134/84 | HR 69 | Ht 61.0 in | Wt 247.6 lb

## 2024-01-19 DIAGNOSIS — I2510 Atherosclerotic heart disease of native coronary artery without angina pectoris: Secondary | ICD-10-CM

## 2024-01-19 DIAGNOSIS — I152 Hypertension secondary to endocrine disorders: Secondary | ICD-10-CM | POA: Diagnosis not present

## 2024-01-19 DIAGNOSIS — I351 Nonrheumatic aortic (valve) insufficiency: Secondary | ICD-10-CM | POA: Diagnosis not present

## 2024-01-19 DIAGNOSIS — E1159 Type 2 diabetes mellitus with other circulatory complications: Secondary | ICD-10-CM

## 2024-01-19 DIAGNOSIS — I359 Nonrheumatic aortic valve disorder, unspecified: Secondary | ICD-10-CM | POA: Diagnosis not present

## 2024-01-19 NOTE — Progress Notes (Signed)
 OFFICE CONSULT NOTE  Chief Complaint:  Murmur  Primary Care Physician: Okey Carlin Redbird, MD  HPI:  [name] is a 78 y.o. female who is being seen today for the evaluation of murmur at the request of Okey Carlin Redbird, MD. this a pleasant 78 year old female kindly referred for evaluation of a murmur.  She was sent as a new patient, however she actually is an existing patient of Dr. Kate.  She has a history of type 2 diabetes, hypertension and obesity and is of [place] descent.  She was previously noted to have an elevated calcium  score of 907 in 2022.  She had a Myoview  stress test prior to that which showed no ischemia.  Her last echo was in January 2021 which showed normal systolic function and aortic valve sclerosis and trivial pericardial effusion.  She is referred today because of a newly auscultated murmur on exam.  She is asymptomatic denying any chest pain or shortness of breath.  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   Candida infection    fingernails   Cholelithiasis    Diabetes mellitus without complication (HCC) 06/2013   Type 2 NIDDM x 1 month   Diverticulosis    Gout    H/O hepatitis 2011   viral   Hearing loss    Hepatitis    Hypertension    Menopause    Obesity    Prediabetes    Shortness of breath    with exertion    Past Surgical History:  Procedure Laterality Date   ABDOMINAL HYSTERECTOMY     JOINT REPLACEMENT     LUMBAR LAMINECTOMY/DECOMPRESSION MICRODISCECTOMY Right 07/20/2013   Procedure: LUMBAR LAMINECTOMY/DECOMPRESSION MICRODISCECTOMY RIGHT LUMBAR FOUR-FIVE;  Surgeon: Victory DELENA Gunnels, MD;  Location: [facility];  Service: Neurosurgery;  Laterality: Right;   REPLACEMENT TOTAL KNEE BILATERAL     2007/2009    FAMHx:  Family History  Problem Relation Age of Onset   Heart disease Mother 59       heart attack   Stroke Father 21    SOCHx:   reports that she has never smoked. She has  never used smokeless tobacco. She reports current alcohol use. She reports that she does not use drugs.  ALLERGIES:  Allergies  Allergen Reactions   Atorvastatin  Other (See Comments)   Gabapentin Other (See Comments)    Caused muscle pain all over    ROS: Pertinent items noted in HPI and remainder of comprehensive ROS otherwise negative.  HOME MEDS: Current Outpatient Medications on File Prior to Visit  Medication Sig Dispense Refill   aspirin  81 MG tablet Take 81 mg by mouth daily.     fish oil-omega-3 fatty acids 1000 MG capsule Take 1 g by mouth daily.     levothyroxine (SYNTHROID) 25 MCG tablet 1 tablet in the morning on an empty stomach     lisinopril  (PRINIVIL ,ZESTRIL ) 40 MG tablet Take 40 mg by mouth daily.  4   Multiple Vitamin (MULTIVITAMIN PO) Take by mouth daily.     oxybutynin (DITROPAN) 5 MG tablet Take 5 mg by mouth 2 (two) times daily. Take one tablet daily     rosuvastatin  (CRESTOR ) 5 MG tablet Take 1 tablet (5 mg total) by mouth daily. 90 tablet 3   verapamil  (VERELAN  PM) 240 MG 24 hr capsule Take 240 mg by mouth daily with breakfast.     No current facility-administered medications on  file prior to visit.    LABS/IMAGING: No results found for this or any previous visit (from the past 48 hours). No results found.  LIPID PANEL: No results found for: CHOL, TRIG, HDL, CHOLHDL, VLDL, LDLCALC, LDLDIRECT  WEIGHTS: Wt Readings from Last 3 Encounters:  01/19/24 247 lb 9.6 oz (112.3 kg)  11/27/21 247 lb (112 kg)  08/24/21 255 lb 6.4 oz (115.8 kg)    VITALS: BP 134/84   Pulse 69   Ht 5' 1 (1.549 m)   Wt 247 lb 9.6 oz (112.3 kg)   SpO2 94%   BMI 46.78 kg/m   EXAM: General appearance: alert, no distress, and morbidly obese Lungs: clear to auscultation bilaterally Heart: regular rate and rhythm, S1, S2 normal, and systolic murmur: early systolic 2/6, crescendo at 2nd right intercostal space Extremities: extremities normal, atraumatic, no  cyanosis or edema Neurologic: Grossly normal  EKG: EKG Interpretation Date/Time:  Thursday January 19 2024 10:27:33 EDT Ventricular Rate:  66 PR Interval:  214 QRS Duration:  94 QT Interval:  420 QTC Calculation: 440 R Axis:   -7  Text Interpretation: Sinus rhythm with 1st degree A-V block Minimal voltage criteria for LVH, may be normal variant ( R in aVL ) Inferior infarct , age undetermined Anterior infarct , age undetermined When compared with ECG of 01-Sep-2017 17:03, No significant change since last tracing Confirmed by Mona Kent [id number]) on 01/19/2024 10:39:48 AM  - personally reviewed  ASSESSMENT: Systolic murmur Coronary artery calcification Morbid obesity Hypertension Dyslipidemia, goal LDL less than 70  PLAN: 1.   [name] is an established patient of Dr. Barnetta, having last been seen in 2023.  She did have an echo in 2021 which showed some aortic valve sclerosis.  Her murmur sounds like aortic sclerosis or possibly mild aortic stenosis.  I would like to repeat an echo.  Will arrange for follow-up with Dr. Kate since he is already established with her.  Fortunately she is asymptomatic with this and denies any chest pain.  Kent KYM Mona, MD, [facility], FNLA, FACP  [place]  Medical Director of the Advanced Lipid Disorders &  Cardiovascular Risk Reduction Clinic Diplomate of the American Board of Clinical Lipidology Attending Cardiologist  Direct Dial: [phone]  Fax: [phone]  Website:  [email or web address]  Kent BROCKS Juliene Kirsh 01/19/2024, 10:39 AM

## 2024-01-19 NOTE — Patient Instructions (Signed)
   Testing/Procedures:  Your physician has requested that you have an echocardiogram. Echocardiography is a painless test that uses sound waves to create images of your heart. It provides your doctor with information about the size and shape of your heart and how well your heart's chambers and valves are working. This procedure takes approximately one hour. There are no restrictions for this procedure. Please do NOT wear cologne, perfume, aftershave, or lotions (deodorant is allowed). Please arrive 15 minutes prior to your appointment time.  Please note: We ask at that you not bring children with you during ultrasound (echo/ vascular) testing. Due to room size and safety concerns, children are not allowed in the ultrasound rooms during exams. Our front office staff cannot provide observation of children in our lobby area while testing is being conducted. An adult accompanying a patient to their appointment will only be allowed in the ultrasound room at the discretion of the ultrasound technician under special circumstances. We apologize for any inconvenience. [address]  Follow-Up: At [facility], you and your health needs are our priority.  As part of our continuing mission to provide you with exceptional heart care, our providers are all part of one team.  This team includes your primary Cardiologist (physician) and Advanced Practice Providers or APPs (Physician Assistants and Nurse Practitioners) who all work together to provide you with the care you need, when you need it.  Your next appointment:   3 month(s)  Provider:   LONNI NANAS MD

## 2024-03-05 ENCOUNTER — Ambulatory Visit (HOSPITAL_COMMUNITY)
Admission: RE | Admit: 2024-03-05 | Discharge: 2024-03-05 | Disposition: A | Discharge status: Home / Self Care | Source: Ambulatory Visit | Attending: Cardiovascular Disease | Admitting: Cardiovascular Disease

## 2024-03-05 DIAGNOSIS — I359 Nonrheumatic aortic valve disorder, unspecified: Secondary | ICD-10-CM | POA: Insufficient documentation

## 2024-03-05 LAB — ECHOCARDIOGRAM COMPLETE
AR max vel: 0.77 cm2
AV Area VTI: 0.8 cm2
AV Area mean vel: 0.82 cm2
AV Mean grad: 12 mmHg
AV Peak grad: 23.9 mmHg
Ao pk vel: 2.44 m/s
Area-P 1/2: 2.68 cm2
S' Lateral: 2.8 cm

## 2024-03-12 ENCOUNTER — Ambulatory Visit: Payer: Self-pay | Admitting: Internal Medicine

## 2024-04-22 NOTE — Progress Notes (Unsigned)
 " Cardiology Office Note:    Date:  04/23/2024   ID:  [name], DOB 06/29/1945, MRN [id number]  PCP:  Colleen Carlin Redbird, MD  Cardiologist:  Lonni LITTIE Nanas, MD  Electrophysiologist:  None   Referring MD: Colleen Carlin Redbird, MD   Chief Complaint  Patient presents with   Aortic Stenosis    History of Present Illness:    [name] is a 79 y.o. female with a hx of type 2 diabetes, hypertension, obesity who presents for follow-up.  She was referred by Dr. Okey for evaluation of shortness of breath, initially seen on 03/26/2019.  Patient reports that she has been short of breath with exertion.  Denies any exertional chest pain.  For exercise, she reports that she goes to [facility] about twice per week.  She walks for 15 to 20 minutes but has to take breaks.  States that walking up a flight of stairs causes her to get short of breath.  States that her sister, who is 26 years old, had a stress test which was positive and ultimately ended up undergoing PCI, which prompted her to seek an evaluation.  She states her blood pressure is usually under good control, 120s to 130s over 70s.  No smoking history.  Does report that she has been told she snores.  Lexiscan  Myoview  was done on 04/18/2019, which showed no evidence of ischemia or prior myocardial infarction.  TTE was done on 05/03/2019, which showed EF 65 to 70%, grade 1 diastolic dysfunction, normal RV function, no significant valvular disease.  Calcium  score 907 on 07/23/2020 (96 percentile).  Echocardiogram 03/05/2024 with normal biventricular function, poorly visualized aortic valve but appeared severe paradoxical low-flow low gradient aortic stenosis.  Since last clinic visit, she reports she is doing okay.  Does report some dyspnea with exertion. Denies any chest pain,  lightheadedness, syncope, lower extremity edema, or palpitations.  Walks 1-2 times per week for 20 minutes.  Past Medical History:  Diagnosis Date    Arthritis    Osteoarthritis in knees   Candida infection    fingernails   Cholelithiasis    Diabetes mellitus without complication (HCC) 06/2013   Type 2 NIDDM x 1 month   Diverticulosis    Gout    H/O hepatitis 2011   viral   Hearing loss    Hepatitis    Hypertension    Menopause    Obesity    Prediabetes    Shortness of breath    with exertion    Past Surgical History:  Procedure Laterality Date   ABDOMINAL HYSTERECTOMY     JOINT REPLACEMENT     LUMBAR LAMINECTOMY/DECOMPRESSION MICRODISCECTOMY Right 07/20/2013   Procedure: LUMBAR LAMINECTOMY/DECOMPRESSION MICRODISCECTOMY RIGHT LUMBAR FOUR-FIVE;  Surgeon: Victory DELENA Gunnels, MD;  Location: [facility];  Service: Neurosurgery;  Laterality: Right;   REPLACEMENT TOTAL KNEE BILATERAL     2007/2009    Current Medications: Current Meds  Medication Sig   aspirin  81 MG tablet Take 81 mg by mouth daily.   fish oil-omega-3 fatty acids 1000 MG capsule Take 1 g by mouth daily.   levothyroxine (SYNTHROID) 25 MCG tablet 1 tablet in the morning on an empty stomach   lisinopril  (PRINIVIL ,ZESTRIL ) 40 MG tablet Take 40 mg by mouth daily.   Multiple Vitamin (MULTIVITAMIN PO) Take by mouth daily.   oxybutynin (DITROPAN) 5 MG tablet Take 5 mg by mouth 2 (two) times daily. Take one tablet daily   rosuvastatin  (CRESTOR ) 5 MG  tablet Take 1 tablet (5 mg total) by mouth daily.   verapamil  (VERELAN  PM) 240 MG 24 hr capsule Take 240 mg by mouth daily with breakfast.     Allergies:   Atorvastatin  and Gabapentin   Social History   Socioeconomic History   Marital status: Widowed    Spouse name: Not on file   Number of children: Not on file   Years of education: Not on file   Highest education level: Not on file  Occupational History   Not on file  Tobacco Use   Smoking status: Never   Smokeless tobacco: Never  Vaping Use   Vaping status: Never Used  Substance and Sexual Activity   Alcohol use: Yes    Comment: occasional   Drug use: No    Sexual activity: Not on file  Other Topics Concern   Not on file  Social History Narrative   Not on file   Social Drivers of Health   Tobacco Use: Low Risk (01/19/2024)   Patient History    Smoking Tobacco Use: Never    Smokeless Tobacco Use: Never    Passive Exposure: Not on file  Financial Resource Strain: Not on file  Food Insecurity: Not on file  Transportation Needs: Not on file  Physical Activity: Not on file  Stress: Not on file  Social Connections: Not on file  Depression (EYV7-0): Not on file  Alcohol Screen: Not on file  Housing: Not on file  Utilities: Not on file  Health Literacy: Not on file     Family History: The patient's family history includes Heart disease (age of onset: 7) in her mother; Stroke (age of onset: 59) in her father.  ROS:   Please see the history of present illness.     All other systems reviewed and are negative.  EKGs/Labs/Other Studies Reviewed:    The following studies were reviewed today:   EKG:   07/15/21: Normal sinus rhythm, rate 73, Q waves in V3, III  TTE 05/03/2019:  1. Left ventricular ejection fraction, by visual estimation, is 65 to  70%. The left ventricle has normal function. There is no left ventricular  hypertrophy.   2. Left ventricular diastolic parameters are consistent with Grade I  diastolic dysfunction (impaired relaxation).   3. The left ventricle has no regional wall motion abnormalities.   4. Global right ventricle has normal systolic function.The right  ventricular size is normal. No increase in right ventricular wall  thickness.   5. Left atrial size was normal.   6. Right atrial size was normal.   7. Trivial pericardial effusion is present.   8. The mitral valve is normal in structure. No evidence of mitral valve  regurgitation.   9. The tricuspid valve is normal in structure.  10. The tricuspid valve is normal in structure. Tricuspid valve  regurgitation is trivial.  11. The aortic valve is  tricuspid. Aortic valve regurgitation is trivial.  Mild aortic valve sclerosis without stenosis.  12. The pulmonic valve was not well visualized. Pulmonic valve  regurgitation is not visualized.  13. Normal pulmonary artery systolic pressure.     Lexiscan  Myoview  04/18/2019: Nuclear stress EF: 72%. The left ventricular ejection fraction is hyperdynamic (>65%). There was no ST segment deviation noted during stress. This is a low risk study. There is no evidence of ischemia or previous myocardial infarction. The study is normal.    Recent Labs: No results found for requested labs within last 365 days.  Recent Lipid Panel  No results found for: CHOL, TRIG, HDL, CHOLHDL, VLDL, LDLCALC, LDLDIRECT  Physical Exam:    VS:  BP 136/82 (BP Location: Left Arm, Patient Position: Sitting)   Pulse 91   Ht 5' 1.2 (1.554 m)   Wt 252 lb 6.4 oz (114.5 kg)   SpO2 94%   BMI 47.38 kg/m     Wt Readings from Last 3 Encounters:  04/23/24 252 lb 6.4 oz (114.5 kg)  01/19/24 247 lb 9.6 oz (112.3 kg)  11/27/21 247 lb (112 kg)     GEN:  Well nourished, well developed in no acute distress HEENT: Normal NECK: No JVD; No carotid bruits LYMPHATICS: No lymphadenopathy CARDIAC: RRR, 2/6 systolic murmur loudest at RUSB RESPIRATORY:  Clear to auscultation without rales, wheezing or rhonchi  ABDOMEN: Soft, non-tender, non-distended MUSCULOSKELETAL:  No edema; No deformity  SKIN: Warm and dry NEUROLOGIC:  Alert and oriented x 3 PSYCHIATRIC:  Normal affect   ASSESSMENT:    1. Aortic valve stenosis, etiology of cardiac valve disease unspecified   2. Coronary artery calcification   3. Essential hypertension   4. Hyperlipidemia, unspecified hyperlipidemia type   5. DOE (dyspnea on exertion)   6. Morbid obesity (HCC)      PLAN:    Aortic stenosis: Echocardiogram in 2021 with aortic valve sclerosis, no stenosis.  Echocardiogram 03/05/2024 with normal biventricular function, poorly  visualized aortic valve but possible severe paradoxical low-flow low gradient aortic stenosis. - Recent echo read as possible severe paradoxical low-flow low gradient aortic stenosis, mean gradient was only 12 mmHg but low stroke-volume index and DI 0.25 and AVA 0.9 cm.  However this was a poor quality study and question accuracy of LVOT VTI, which is affecting DI and AVA measurements.  Recommend repeating limited echo for better evaluation of aortic stenosis  Dyspnea on exertion: Suspect multifactorial related to deconditioning, ILD, possible aortic stenosis as above.    Hypertension: On lisinopril  40 mg daily and verapamil  240 mg daily.  Appears controlled  Hyperlipidemia: LDL 48 on 09/09/2023.  On rosuvastatin  5 mg daily.  Calcium  score 907 on 07/23/2020 (96 percentile),  Type 2 diabetes: A1c 7.1% on 03/2024  Snoring: Concern for OSA, sleep study ordered but she has declined  Morbid obesity: Body mass index is 47.38 kg/m.  Diet/exercise recommended  Lung nodule: Small lung nodule noted on calcium  score 07/23/2020.  CT chest 07/09/2021 showed multiple pulmonary nodules largest measuring 7 mm, also with scattered interstitial opacities that may represent interstitial lung disease.  Referred to pulmonology  RTC in 3 months   Medication Adjustments/Labs and Tests Ordered: Current medicines are reviewed at length with the patient today.  Concerns regarding medicines are outlined above.  Orders Placed This Encounter  Procedures   ECHOCARDIOGRAM LIMITED   No orders of the defined types were placed in this encounter.   Patient Instructions  Medication Instructions:  Your physician recommends that you continue on your current medications as directed. Please refer to the Current Medication list given to you today.  *If you need a refill on your cardiac medications before your next appointment, please call your pharmacy*  Lab Work: none If you have labs (blood work) drawn today and your  tests are completely normal, you will receive your results only by: MyChart Message (if you have MyChart) OR A paper copy in the mail If you have any lab test that is abnormal or we need to change your treatment, we will call you to review the results.  Testing/Procedures: Your physician  has requested that you have an echocardiogram. Echocardiography is a painless test that uses sound waves to create images of your heart. It provides your doctor with information about the size and shape of your heart and how well your hearts chambers and valves are working. This procedure takes approximately one hour. There are no restrictions for this procedure. Please do NOT wear cologne, perfume, aftershave, or lotions (deodorant is allowed). Please arrive 15 minutes prior to your appointment time.  Please note: We ask at that you not bring children with you during ultrasound (echo/ vascular) testing. Due to room size and safety concerns, children are not allowed in the ultrasound rooms during exams. Our front office staff cannot provide observation of children in our lobby area while testing is being conducted. An adult accompanying a patient to their appointment will only be allowed in the ultrasound room at the discretion of the ultrasound technician under special circumstances. We apologize for any inconvenience.   Follow-Up: At [facility], you and your health needs are our priority.  As part of our continuing mission to provide you with exceptional heart care, our providers are all part of one team.  This team includes your primary Cardiologist (physician) and Advanced Practice Providers or APPs (Physician Assistants and Nurse Practitioners) who all work together to provide you with the care you need, when you need it.  Your next appointment:   3 months  Provider:   Dr.Degan Hanser  We recommend signing up for the patient portal called MyChart.  Sign up information is provided on this After  Visit Summary.  MyChart is used to connect with patients for Virtual Visits (Telemedicine).  Patients are able to view lab/test results, encounter notes, upcoming appointments, etc.  Non-urgent messages can be sent to your provider as well.   To learn more about what you can do with MyChart, go to [email or web address].   Other Instructions none            Signed, Lonni LITTIE Nanas, MD  04/23/2024 9:42 AM    [facility] "

## 2024-04-23 ENCOUNTER — Ambulatory Visit: Attending: Cardiology | Admitting: Cardiology

## 2024-04-23 VITALS — BP 136/82 | HR 91 | Ht 61.2 in | Wt 252.4 lb

## 2024-04-23 DIAGNOSIS — R0609 Other forms of dyspnea: Secondary | ICD-10-CM | POA: Diagnosis not present

## 2024-04-23 DIAGNOSIS — E785 Hyperlipidemia, unspecified: Secondary | ICD-10-CM | POA: Diagnosis not present

## 2024-04-23 DIAGNOSIS — I10 Essential (primary) hypertension: Secondary | ICD-10-CM

## 2024-04-23 DIAGNOSIS — I2510 Atherosclerotic heart disease of native coronary artery without angina pectoris: Secondary | ICD-10-CM

## 2024-04-23 DIAGNOSIS — I350 Nonrheumatic aortic (valve) stenosis: Secondary | ICD-10-CM

## 2024-04-23 NOTE — Patient Instructions (Signed)
 Medication Instructions:  Your physician recommends that you continue on your current medications as directed. Please refer to the Current Medication list given to you today.  *If you need a refill on your cardiac medications before your next appointment, please call your pharmacy*  Lab Work: none If you have labs (blood work) drawn today and your tests are completely normal, you will receive your results only by: MyChart Message (if you have MyChart) OR A paper copy in the mail If you have any lab test that is abnormal or we need to change your treatment, we will call you to review the results.  Testing/Procedures: Your physician has requested that you have an echocardiogram. Echocardiography is a painless test that uses sound waves to create images of your heart. It provides your doctor with information about the size and shape of your heart and how well your hearts chambers and valves are working. This procedure takes approximately one hour. There are no restrictions for this procedure. Please do NOT wear cologne, perfume, aftershave, or lotions (deodorant is allowed). Please arrive 15 minutes prior to your appointment time.  Please note: We ask at that you not bring children with you during ultrasound (echo/ vascular) testing. Due to room size and safety concerns, children are not allowed in the ultrasound rooms during exams. Our front office staff cannot provide observation of children in our lobby area while testing is being conducted. An adult accompanying a patient to their appointment will only be allowed in the ultrasound room at the discretion of the ultrasound technician under special circumstances. We apologize for any inconvenience.   Follow-Up: At [facility], you and your health needs are our priority.  As part of our continuing mission to provide you with exceptional heart care, our providers are all part of one team.  This team includes your primary Cardiologist  (physician) and Advanced Practice Providers or APPs (Physician Assistants and Nurse Practitioners) who all work together to provide you with the care you need, when you need it.  Your next appointment:   3 months  Provider:   Dr.Schumann  We recommend signing up for the patient portal called MyChart.  Sign up information is provided on this After Visit Summary.  MyChart is used to connect with patients for Virtual Visits (Telemedicine).  Patients are able to view lab/test results, encounter notes, upcoming appointments, etc.  Non-urgent messages can be sent to your provider as well.   To learn more about what you can do with MyChart, go to [email or web address].   Other Instructions none

## 2024-05-23 ENCOUNTER — Ambulatory Visit (HOSPITAL_COMMUNITY)

## 2024-07-26 ENCOUNTER — Ambulatory Visit: Admitting: Cardiology
